# Patient Record
Sex: Male | Born: 1955 | Race: White | Hispanic: No | Marital: Married | Smoking: Former smoker
Health system: Southern US, Community
[De-identification: ages and names within clinical notes are randomized; demographics above are authoritative.]

## PROBLEM LIST (undated history)

## (undated) DIAGNOSIS — M1990 Unspecified osteoarthritis, unspecified site: Secondary | ICD-10-CM

## (undated) DIAGNOSIS — K219 Gastro-esophageal reflux disease without esophagitis: Secondary | ICD-10-CM

## (undated) DIAGNOSIS — I719 Aortic aneurysm of unspecified site, without rupture: Secondary | ICD-10-CM

## (undated) DIAGNOSIS — M5416 Radiculopathy, lumbar region: Secondary | ICD-10-CM

## (undated) DIAGNOSIS — R9439 Abnormal result of other cardiovascular function study: Secondary | ICD-10-CM

## (undated) DIAGNOSIS — R000 Tachycardia, unspecified: Secondary | ICD-10-CM

## (undated) DIAGNOSIS — I2510 Atherosclerotic heart disease of native coronary artery without angina pectoris: Secondary | ICD-10-CM

## (undated) DIAGNOSIS — G8929 Other chronic pain: Secondary | ICD-10-CM

## (undated) DIAGNOSIS — J189 Pneumonia, unspecified organism: Secondary | ICD-10-CM

## (undated) HISTORY — PX: UMBILICAL HERNIA REPAIR: SHX196

## (undated) HISTORY — PX: BACK SURGERY: SHX140

## (undated) HISTORY — PX: HERNIA REPAIR: SHX51

## (undated) HISTORY — PX: PAIN PUMP REMOVAL: SHX6391

## (undated) HISTORY — PX: SPINAL CORD STIMULATOR IMPLANT: SHX2422

## (undated) HISTORY — PX: PAIN PUMP IMPLANTATION: SHX330

---

## 1980-03-31 HISTORY — PX: LUMBAR LAMINECTOMY: SHX95

## 1992-03-31 DIAGNOSIS — J189 Pneumonia, unspecified organism: Secondary | ICD-10-CM

## 1992-03-31 HISTORY — DX: Pneumonia, unspecified organism: J18.9

## 1994-03-31 HISTORY — PX: CERVICAL LAMINECTOMY: SHX94

## 1996-03-31 HISTORY — PX: POSTERIOR LUMBAR FUSION: SHX6036

## 2015-03-01 HISTORY — PX: SHOULDER ARTHROSCOPY WITH ROTATOR CUFF REPAIR AND OPEN BICEPS TENODESIS: SHX6677

## 2015-09-25 DIAGNOSIS — R9439 Abnormal result of other cardiovascular function study: Secondary | ICD-10-CM

## 2015-09-25 HISTORY — DX: Abnormal result of other cardiovascular function study: R94.39

## 2015-10-03 ENCOUNTER — Encounter (HOSPITAL_COMMUNITY)
Admission: AD | Disposition: A | Discharge status: Home / Self Care | Payer: Self-pay | Source: Other Acute Inpatient Hospital | Attending: Cardiology

## 2015-10-03 ENCOUNTER — Observation Stay (HOSPITAL_COMMUNITY)
Admission: AD | Admit: 2015-10-03 | Discharge: 2015-10-04 | Disposition: A | Discharge status: Home / Self Care | Payer: Medicare HMO | Source: Other Acute Inpatient Hospital | Attending: Cardiology | Admitting: Cardiology

## 2015-10-03 ENCOUNTER — Encounter (HOSPITAL_COMMUNITY): Payer: Self-pay | Admitting: Physician Assistant

## 2015-10-03 DIAGNOSIS — R079 Chest pain, unspecified: Secondary | ICD-10-CM | POA: Diagnosis present

## 2015-10-03 DIAGNOSIS — R001 Bradycardia, unspecified: Secondary | ICD-10-CM | POA: Diagnosis not present

## 2015-10-03 DIAGNOSIS — I25110 Atherosclerotic heart disease of native coronary artery with unstable angina pectoris: Secondary | ICD-10-CM | POA: Diagnosis not present

## 2015-10-03 DIAGNOSIS — I959 Hypotension, unspecified: Secondary | ICD-10-CM | POA: Diagnosis not present

## 2015-10-03 DIAGNOSIS — R931 Abnormal findings on diagnostic imaging of heart and coronary circulation: Secondary | ICD-10-CM

## 2015-10-03 DIAGNOSIS — I712 Thoracic aortic aneurysm, without rupture, unspecified: Secondary | ICD-10-CM

## 2015-10-03 DIAGNOSIS — I200 Unstable angina: Secondary | ICD-10-CM | POA: Diagnosis not present

## 2015-10-03 DIAGNOSIS — M549 Dorsalgia, unspecified: Secondary | ICD-10-CM | POA: Diagnosis not present

## 2015-10-03 DIAGNOSIS — G8929 Other chronic pain: Secondary | ICD-10-CM | POA: Insufficient documentation

## 2015-10-03 DIAGNOSIS — R072 Precordial pain: Secondary | ICD-10-CM | POA: Diagnosis present

## 2015-10-03 DIAGNOSIS — R9439 Abnormal result of other cardiovascular function study: Secondary | ICD-10-CM | POA: Diagnosis present

## 2015-10-03 DIAGNOSIS — Z7982 Long term (current) use of aspirin: Secondary | ICD-10-CM | POA: Insufficient documentation

## 2015-10-03 DIAGNOSIS — E7800 Pure hypercholesterolemia, unspecified: Secondary | ICD-10-CM | POA: Insufficient documentation

## 2015-10-03 DIAGNOSIS — I2510 Atherosclerotic heart disease of native coronary artery without angina pectoris: Secondary | ICD-10-CM | POA: Insufficient documentation

## 2015-10-03 DIAGNOSIS — Z87891 Personal history of nicotine dependence: Secondary | ICD-10-CM | POA: Insufficient documentation

## 2015-10-03 HISTORY — DX: Other chronic pain: G89.29

## 2015-10-03 HISTORY — DX: Abnormal result of other cardiovascular function study: R94.39

## 2015-10-03 HISTORY — DX: Aortic aneurysm of unspecified site, without rupture: I71.9

## 2015-10-03 HISTORY — DX: Pneumonia, unspecified organism: J18.9

## 2015-10-03 HISTORY — DX: Gastro-esophageal reflux disease without esophagitis: K21.9

## 2015-10-03 HISTORY — DX: Tachycardia, unspecified: R00.0

## 2015-10-03 HISTORY — DX: Atherosclerotic heart disease of native coronary artery without angina pectoris: I25.10

## 2015-10-03 HISTORY — DX: Unspecified osteoarthritis, unspecified site: M19.90

## 2015-10-03 HISTORY — DX: Radiculopathy, lumbar region: M54.16

## 2015-10-03 HISTORY — PX: CARDIAC CATHETERIZATION: SHX172

## 2015-10-03 LAB — CBC
HEMATOCRIT: 46.3 % (ref 39.0–52.0)
HEMOGLOBIN: 15.6 g/dL (ref 13.0–17.0)
MCH: 30.2 pg (ref 26.0–34.0)
MCHC: 33.7 g/dL (ref 30.0–36.0)
MCV: 89.7 fL (ref 78.0–100.0)
Platelets: 205 10*3/uL (ref 150–400)
RBC: 5.16 MIL/uL (ref 4.22–5.81)
RDW: 13.1 % (ref 11.5–15.5)
WBC: 6.1 10*3/uL (ref 4.0–10.5)

## 2015-10-03 LAB — CREATININE, SERUM: Creatinine, Ser: 1.04 mg/dL (ref 0.61–1.24)

## 2015-10-03 LAB — TSH: TSH: 2.255 u[IU]/mL (ref 0.350–4.500)

## 2015-10-03 SURGERY — LEFT HEART CATH AND CORONARY ANGIOGRAPHY
Anesthesia: LOCAL

## 2015-10-03 MED ORDER — IOPAMIDOL (ISOVUE-370) INJECTION 76%
INTRAVENOUS | Status: AC
  Filled 2015-10-03: qty 100

## 2015-10-03 MED ORDER — LIDOCAINE HCL (PF) 1 % IJ SOLN
INTRAMUSCULAR | Status: AC
  Filled 2015-10-03: qty 30

## 2015-10-03 MED ORDER — FENTANYL CITRATE (PF) 100 MCG/2ML IJ SOLN
INTRAMUSCULAR | Status: DC | PRN
  Administered 2015-10-03: 50 ug via INTRAVENOUS

## 2015-10-03 MED ORDER — VERAPAMIL HCL 2.5 MG/ML IV SOLN
INTRAVENOUS | Status: DC | PRN
  Administered 2015-10-03: 10 mL via INTRA_ARTERIAL

## 2015-10-03 MED ORDER — ASPIRIN EC 81 MG PO TBEC: 81.0000 mg | DELAYED_RELEASE_TABLET | Freq: Every day | ORAL | Status: DC

## 2015-10-03 MED ORDER — ONDANSETRON HCL 4 MG/2ML IJ SOLN: 4.0000 mg | Freq: Four times a day (QID) | INTRAMUSCULAR | Status: DC | PRN

## 2015-10-03 MED ORDER — TRAZODONE HCL 100 MG PO TABS
100.0000 mg | ORAL_TABLET | Freq: Every evening | ORAL | Status: AC | PRN
  Administered 2015-10-03: 100 mg via ORAL
  Filled 2015-10-03: qty 1

## 2015-10-03 MED ORDER — ACETAMINOPHEN 325 MG PO TABS: 650.0000 mg | ORAL_TABLET | ORAL | Status: DC | PRN

## 2015-10-03 MED ORDER — SODIUM CHLORIDE 0.9 % IV SOLN: 250.0000 mL | INTRAVENOUS | Status: DC | PRN

## 2015-10-03 MED ORDER — HEPARIN SODIUM (PORCINE) 5000 UNIT/ML IJ SOLN
5000.0000 [IU] | Freq: Three times a day (TID) | INTRAMUSCULAR | Status: DC
  Administered 2015-10-04: 5000 [IU] via SUBCUTANEOUS
  Filled 2015-10-03: qty 1

## 2015-10-03 MED ORDER — HYDROCODONE-ACETAMINOPHEN 5-325 MG PO TABS
1.0000 | ORAL_TABLET | Freq: Four times a day (QID) | ORAL | Status: AC | PRN
  Administered 2015-10-03 – 2015-10-04 (×2): 1 via ORAL
  Filled 2015-10-03 (×2): qty 1

## 2015-10-03 MED ORDER — MIDAZOLAM HCL 2 MG/2ML IJ SOLN
INTRAMUSCULAR | Status: AC
Start: 2015-10-03 — End: 2015-10-03
  Filled 2015-10-03: qty 2

## 2015-10-03 MED ORDER — LIDOCAINE HCL (PF) 1 % IJ SOLN
INTRAMUSCULAR | Status: DC | PRN
  Administered 2015-10-03: 3 mL

## 2015-10-03 MED ORDER — HEPARIN SODIUM (PORCINE) 1000 UNIT/ML IJ SOLN
INTRAMUSCULAR | Status: AC
  Filled 2015-10-03: qty 1

## 2015-10-03 MED ORDER — VERAPAMIL HCL 2.5 MG/ML IV SOLN
INTRAVENOUS | Status: AC
  Filled 2015-10-03: qty 2

## 2015-10-03 MED ORDER — ATORVASTATIN CALCIUM 80 MG PO TABS: 80.0000 mg | ORAL_TABLET | Freq: Every day | ORAL | Status: DC

## 2015-10-03 MED ORDER — IOPAMIDOL (ISOVUE-370) INJECTION 76%
INTRAVENOUS | Status: DC | PRN
  Administered 2015-10-03: 90 mL via INTRA_ARTERIAL

## 2015-10-03 MED ORDER — SODIUM CHLORIDE 0.9 % WEIGHT BASED INFUSION
3.0000 mL/kg/h | INTRAVENOUS | Status: AC
  Administered 2015-10-03: 3 mL/kg/h via INTRAVENOUS

## 2015-10-03 MED ORDER — SODIUM CHLORIDE 0.9% FLUSH: 3.0000 mL | INTRAVENOUS | Status: DC | PRN

## 2015-10-03 MED ORDER — SODIUM CHLORIDE 0.9 % IV SOLN
INTRAVENOUS | Status: DC | PRN
  Administered 2015-10-03: 100 mL/h via INTRAVENOUS

## 2015-10-03 MED ORDER — NITROGLYCERIN 0.4 MG SL SUBL: 0.4000 mg | SUBLINGUAL_TABLET | SUBLINGUAL | Status: DC | PRN

## 2015-10-03 MED ORDER — SODIUM CHLORIDE 0.9% FLUSH
3.0000 mL | Freq: Two times a day (BID) | INTRAVENOUS | Status: DC
  Administered 2015-10-03: 3 mL via INTRAVENOUS

## 2015-10-03 MED ORDER — HEPARIN SODIUM (PORCINE) 1000 UNIT/ML IJ SOLN
INTRAMUSCULAR | Status: DC | PRN
  Administered 2015-10-03: 5000 [IU] via INTRAVENOUS

## 2015-10-03 MED ORDER — FENTANYL CITRATE (PF) 100 MCG/2ML IJ SOLN
INTRAMUSCULAR | Status: AC
  Filled 2015-10-03: qty 2

## 2015-10-03 MED ORDER — MIDAZOLAM HCL 2 MG/2ML IJ SOLN
INTRAMUSCULAR | Status: DC | PRN
  Administered 2015-10-03: 2 mg via INTRAVENOUS

## 2015-10-03 MED ORDER — HEPARIN (PORCINE) IN NACL 2-0.9 UNIT/ML-% IJ SOLN
INTRAMUSCULAR | Status: DC | PRN
  Administered 2015-10-03: 1000 mL

## 2015-10-03 MED ORDER — HEPARIN (PORCINE) IN NACL 2-0.9 UNIT/ML-% IJ SOLN
INTRAMUSCULAR | Status: AC
  Filled 2015-10-03: qty 1000

## 2015-10-03 SURGICAL SUPPLY — 11 items

## 2015-10-03 NOTE — Progress Notes (Addendum)
Patient arrived with [facility]  From [facility] to room 2w17, patient was placed on monitor and CCMD was called, Dr. Swaziland in room to see patient. Cath lab called and arrived to pick up patient for Cath, Patient was consented and Cath lab staff took patient to cath lab on transport monitor.  Shubh Chiara, Randall An RN

## 2015-10-03 NOTE — H&P (Signed)
Patient ID: Jacques Fife MRN: [id number], DOB/AGE: 31-May-1955   Admit date: 10/03/2015   Primary Physician: No primary care provider on file.  Dr. Sherryll Burger [facility]) Primary Cardiologist: New  Pt. Profile:  Yussuf Sawdey is a 60 y.o. male with no significant past medical except chronic back pain and multiple back surgeries hx who transferred from [facility] ER for cath.   HPI: As above. Intermitted history of substernal chest pressure with exertion for the past 3 weeks. Lasting few minutes to hours. Radiation across his lower chest. No exertional shortness of breath. Never smoker. Recent underwent Myoview 09/25/15 which showed moderate reversibility in an anterior and inferior apical myocardium. Study was high risk. He presented to the [facility] ED today with chest pain. We're point-of-care troponin negative. EKG shows T-wave inversion in inferior anterior lead. Creatinine normal. Chest x-ray clear. BNP normal. He was given aspirin, statin. Started on IV heparin and IV nitroglycerin and sent to the [facility] ED for cath. The patient denies nausea, vomiting, lower extremity edema, orthopnea, PND, syncope.  Reports history of palpitation many years ago.  Problem List  Past Medical History  Diagnosis Date  . Abnormal nuclear stress test 09/25/15  . GERD (gastroesophageal reflux disease)   . Lumbar pain     Past Surgical History  Procedure Laterality Date  . Back surgery    . Rotator cuff repair       Allergies  Allergies not on file   Home Medications  Prior to Admission medications   Not on File    Family History  Family History  Problem Relation Age of Onset  . Heart attack Father     Social History  Social History   Social History  . Marital Status: Married    Spouse Name: N/A  . Number of Children: N/A  . Years of Education: N/A   Occupational History  . Not on file.   Social History Main Topics  . Smoking status: Never Smoker   . Smokeless  tobacco: Not on file  . Alcohol Use: Not on file  . Drug Use: Not on file  . Sexual Activity: Not on file   Other Topics Concern  . Not on file   Social History Narrative  . No narrative on file     All other systems reviewed and are otherwise negative except as noted above.  Physical Exam  There were no vitals taken for this visit.  General: Pleasant, NAD Psych: Normal affect. Neuro: Alert and oriented X 3. Moves all extremities spontaneously. HEENT: Normal  Neck: Supple without bruits or JVD. Lungs:  Resp regular and unlabored, CTA. Heart: RRR no s3, s4, or murmurs. Abdomen: Soft, non-tender, non-distended, BS + x 4.  Extremities: No clubbing, cyanosis or edema. DP/PT/Radials 2+ and equal bilaterally.  Labs  No results for input(s): CKTOTAL, CKMB, TROPONINI in the last 72 hours. No results found for: WBC, HGB, HCT, MCV, PLT No results for input(s): NA, K, CL, CO2, BUN, CREATININE, CALCIUM, PROT, BILITOT, ALKPHOS, ALT, AST, GLUCOSE in the last 168 hours.  Invalid input(s): LABALBU No results found for: CHOL, HDL, LDLCALC, TRIG No results found for: DDIMER   Radiology/Studies  No results found.  ECG  EKG from [facility] showed normal sinus rhythm at a rate of 56 bpm T-wave inversion in inferior anterior lead.   ASSESSMENT AND PLAN   1. Chest pain - Concerning for anginal pain. Recently had high-risk Myoview. Ongoing chest pain for the past 3  weeks. EKG with T-wave inversion in inferior anterior lead. Continue IV heparin and nitroglycerin. For cath later today. - Check lipid panel, A1c, CBC, BMET and TSH in morning.  SignedManson Passey, PA-C 10/03/2015, 3:37 PM Pager [phone] Patient seen and examined and history reviewed. Agree with above findings and plan.  60 yo WM presents with 3 week history of intermittent chest pain sometimes lasting 3 hours. Recent stress Myoview last week high risk with anterior and inferoapical ischemia. EF 50%. Ecg today  shows T wave inversion in the anterior and inferior leads. Troponin negative. CXR OK. Other labs normal. Will admit. Continue IV heparin and NTG. Statin.  Plan LHC and possible coronary intervention today. The procedure and risks were reviewed including but not limited to death, myocardial infarction, stroke, arrythmias, bleeding, transfusion, emergency surgery, dye allergy, or renal dysfunction. The patient voices understanding and is agreeable to proceed.   Karanvir Balderston Swaziland, MDFACC 10/03/2015 3:43 PM

## 2015-10-03 NOTE — Progress Notes (Signed)
Patient with some bleeding at TR band site, 6cc air in band, 2CC added (that was previously removed) bleeding has stopped will wait 30 minutes before removing more air per protocol. Will monitor patient. Kamyla Olejnik, Randall An RN

## 2015-10-03 NOTE — Interval H&P Note (Signed)
History and Physical Interval Note:  10/03/2015 3:47 PM  Brian Mccormick  has presented today for surgery, with the diagnosis of Chest Pain  The various methods of treatment have been discussed with the patient and family. After consideration of risks, benefits and other options for treatment, the patient has consented to  Procedure(s): Left Heart Cath and Coronary Angiography (N/A) as a surgical intervention .  The patient's history has been reviewed, patient examined, no change in status, stable for surgery.  I have reviewed the patient's chart and labs.  Questions were answered to the patient's satisfaction.   Cath Lab Visit (complete for each Cath Lab visit)  Clinical Evaluation Leading to the Procedure:   ACS: Yes.    Non-ACS:    Anginal Classification: CCS IV  Anti-ischemic medical therapy: Minimal Therapy (1 class of medications)  Non-Invasive Test Results: High-risk stress test findings: cardiac mortality >3%/year  Prior CABG: No previous CABG        Theron Arista [facility] 10/03/2015 3:47 PM

## 2015-10-04 ENCOUNTER — Observation Stay (HOSPITAL_BASED_OUTPATIENT_CLINIC_OR_DEPARTMENT_OTHER): Payer: Medicare HMO

## 2015-10-04 ENCOUNTER — Observation Stay (HOSPITAL_COMMUNITY): Payer: Medicare HMO

## 2015-10-04 ENCOUNTER — Ambulatory Visit: Payer: Self-pay | Admitting: Cardiology

## 2015-10-04 ENCOUNTER — Encounter (HOSPITAL_COMMUNITY): Payer: Self-pay | Admitting: Cardiology

## 2015-10-04 DIAGNOSIS — R079 Chest pain, unspecified: Secondary | ICD-10-CM | POA: Diagnosis not present

## 2015-10-04 DIAGNOSIS — R931 Abnormal findings on diagnostic imaging of heart and coronary circulation: Secondary | ICD-10-CM | POA: Diagnosis not present

## 2015-10-04 DIAGNOSIS — I2510 Atherosclerotic heart disease of native coronary artery without angina pectoris: Secondary | ICD-10-CM | POA: Diagnosis not present

## 2015-10-04 DIAGNOSIS — M549 Dorsalgia, unspecified: Secondary | ICD-10-CM | POA: Diagnosis not present

## 2015-10-04 DIAGNOSIS — I200 Unstable angina: Secondary | ICD-10-CM | POA: Diagnosis not present

## 2015-10-04 DIAGNOSIS — I25110 Atherosclerotic heart disease of native coronary artery with unstable angina pectoris: Secondary | ICD-10-CM | POA: Diagnosis not present

## 2015-10-04 DIAGNOSIS — G8929 Other chronic pain: Secondary | ICD-10-CM | POA: Diagnosis not present

## 2015-10-04 LAB — ECHOCARDIOGRAM COMPLETE
AO mean calculated velocity dopler: 137 cm/s
AOPV: 0.44 m/s
AOVTI: 36.3 cm
AV Area VTI index: 0.69 cm2/m2
AV Area mean vel: 1.32 cm2
AV peak Index: 0.63
AV pk vel: 172 cm/s
AV vel: 1.52
AVAREAMEANVIN: 0.6 cm2/m2
AVAREAVTI: 1.39 cm2
AVCELMEANRAT: 0.42
AVG: 8 mmHg
AVPG: 12 mmHg
CHL CUP AV VALUE AREA INDEX: 0.69
E decel time: 359 msec
E/e' ratio: 9.41
FS: 24 % — AB (ref 28–44)
HEIGHTINCHES: 71 in
IV/PV OW: 0.78
LA ID, A-P, ES: 34 mm
LA diam end sys: 34 mm
LA vol index: 15.7 mL/m2
LA vol: 34.6 mL
LADIAMINDEX: 1.55 cm/m2
LAVOLA4C: 28.7 mL
LDCA: 3.14 cm2
LV PW d: 12.9 mm — AB (ref 0.6–1.1)
LV SIMPSON'S DISK: 45
LV TDI E'LATERAL: 5.77
LV TDI E'MEDIAL: 6.64
LV e' LATERAL: 5.77 cm/s
LV sys vol: 43 mL (ref 21–61)
LVDIAVOL: 78 mL (ref 62–150)
LVDIAVOLIN: 36 mL/m2
LVEEAVG: 9.41
LVEEMED: 9.41
LVOT SV: 55 mL
LVOT VTI: 17.6 cm
LVOT diameter: 20 mm
LVOT peak VTI: 0.48 cm
LVOT peak vel: 76.1 cm/s
LVSYSVOLIN: 20 mL/m2
MV Dec: 359
MV pk E vel: 54.3 m/s
MVPKAVEL: 81 m/s
Stroke v: 35 ml
Valve area: 1.52 cm2
WEIGHTICAEL: 3544 [oz_av]

## 2015-10-04 LAB — CBC
HCT: 47.5 % (ref 39.0–52.0)
Hemoglobin: 16 g/dL (ref 13.0–17.0)
MCH: 30.2 pg (ref 26.0–34.0)
MCHC: 33.7 g/dL (ref 30.0–36.0)
MCV: 89.8 fL (ref 78.0–100.0)
PLATELETS: 215 10*3/uL (ref 150–400)
RBC: 5.29 MIL/uL (ref 4.22–5.81)
RDW: 13.1 % (ref 11.5–15.5)
WBC: 6.1 10*3/uL (ref 4.0–10.5)

## 2015-10-04 LAB — BASIC METABOLIC PANEL
ANION GAP: 5 (ref 5–15)
BUN: 17 mg/dL (ref 6–20)
CALCIUM: 8.9 mg/dL (ref 8.9–10.3)
CO2: 23 mmol/L (ref 22–32)
CREATININE: 1.07 mg/dL (ref 0.61–1.24)
Chloride: 109 mmol/L (ref 101–111)
GLUCOSE: 94 mg/dL (ref 65–99)
POTASSIUM: 4.1 mmol/L (ref 3.5–5.1)
SODIUM: 137 mmol/L (ref 135–145)

## 2015-10-04 LAB — LIPID PANEL
CHOLESTEROL: 204 mg/dL — AB (ref 0–200)
HDL: 49 mg/dL (ref >40)
LDL Cholesterol: 124 mg/dL — ABNORMAL HIGH (ref 0–99)
Total CHOL/HDL Ratio: 4.2 RATIO
Triglycerides: 155 mg/dL — ABNORMAL HIGH (ref <150)
VLDL: 31 mg/dL (ref 0–40)

## 2015-10-04 LAB — HEMOGLOBIN A1C
HEMOGLOBIN A1C: 5.5 % (ref 4.8–5.6)
MEAN PLASMA GLUCOSE: 111 mg/dL

## 2015-10-04 MED ORDER — ATORVASTATIN CALCIUM 40 MG PO TABS: 40.0000 mg | ORAL_TABLET | Freq: Every day | ORAL | Status: DC

## 2015-10-04 MED ORDER — IOPAMIDOL (ISOVUE-370) INJECTION 76%
INTRAVENOUS | Status: AC
  Administered 2015-10-04: 100 mL
  Filled 2015-10-04: qty 100

## 2015-10-04 MED ORDER — ASPIRIN 81 MG PO TBEC: 81.0000 mg | DELAYED_RELEASE_TABLET | Freq: Every day | ORAL | Status: DC

## 2015-10-04 MED FILL — Nitroglycerin IV Soln 200 MCG/ML in D5W: INTRAVENOUS | Qty: 250 | Status: AC

## 2015-10-04 MED FILL — Heparin Sodium (Porcine) 100 Unt/ML in Sodium Chloride 0.45%: INTRAMUSCULAR | Qty: 250 | Status: AC

## 2015-10-04 NOTE — Care Management Note (Addendum)
Case Management Note  Patient Details  Name: Brian Mccormick MRN: [id number] Date of Birth: 09-09-1955  Subjective/Objective:   60 y.o. M admitted to 2W17 with CP. Pt lives in private residence with wife in [place]. Independent pta. Do not anticipate any HH needs.                  Action/Plan:Will continue to follow for discharge/disposition needs.   Expected Discharge Date:                  Expected Discharge Plan:     In-House Referral:     Discharge planning Services  CM Consult  Post Acute Care Choice:    Choice offered to:     DME Arranged:    DME Agency:     HH Arranged:    HH Agency:     Status of Service:  In process, will continue to follow  If discussed at Long Length of Stay Meetings, dates discussed:    Additional Comments:  Yvone Neu, RN 10/04/2015, 10:57 AM

## 2015-10-04 NOTE — Discharge Summary (Signed)
Discharge Summary    Patient ID: Brian Mccormick,  MRN: [id number], DOB/AGE: [birth year] 60 y.o.  Admit date: 10/03/2015 Discharge date: 10/04/2015  Primary Care Provider: [facility] Primary Cardiologist: New - Wants to Follow-Up in [place], [place]  Discharge Diagnoses    Principal Problem:   Unstable angina [facility]) Active Problems:   Abnormal nuclear stress test   Chest pain  History of Present Illness     Brian Mccormick is a 60 y.o. male with no significant past medical except chronic back pain and multiple back surgeries who was transferred from [facility] to [facility] on 10/03/2015 for evaluation of chest pain.   He reported a history of intermittent substernal chest pressure with exertion for the past 3 weeks, lasting a few minutes to hours. Also reported radiation across his lower chest. No exertional shortness of breath. He had a recent Myoview on 09/25/15 which showed moderate reversibility in an anterior and inferior apical myocardium and was overall high-risk. His initial POC troponin was negative but his EKG did show T-wave inversion in the  inferior and anterior leads. He was given aspirin and a statin along with being started on IV heparin and IV nitroglycerin and sent to the [facility] ED for a likely cardiac catheterization.   Hospital Course     Consultants: None  The risks and benefits of the procedure were discussed with the patient and he agreed to proceed. His cardiac catheterization showed nonobstructive CAD with 50% stenosis in Ost 2nd Diag, 20% stenosis Prox Cx, and 20% Prox RCA. LV function was normal by cath but an enlarged thoracic aorta c/w aneurysm was noted. Therefore a CT angiogram of the aorta and echo were planned for the following day.  On 10/04/2015, he denied any repeat episodes of chest discomfort. His right radial cath site was stable without evidence of ecchymosis or a hematoma. LDL was elevated to 124, therefore he will remain on statin therapy at  time of discharge.  An echocardiogram was obtained and showed an EF of 40% to 45% with grade 1 diastolic dysfunction. A CT of the aorta showed a 4.1 cm ascending aortic aneurysm, without complicating features. Annual imaging followup by CTA or MRA was recommended.   He was last examined by Dr. Swaziland and deemed stable for discharge. He was not started on a BB secondary to bradycardia (HR in the high-40's to mid-50's overnight) nor an ACE-I/ARB due to hypotension. Would consider the addition of these medications at time of follow-up if HR and BP allows. Cardiology hospital follow-up has been arranged with Dr. Purvis Sheffield in [place], [place].  _____________  Discharge Vitals Blood pressure 116/77, pulse 61, temperature 98 F (36.7 C), temperature source Oral, resp. rate 17, height 5\' 11"  (1.803 m), weight 221 lb 8 oz (100.472 kg), SpO2 95 %.  Filed Weights   10/03/15 1649  Weight: 221 lb 8 oz (100.472 kg)    Labs & Radiologic Studies     CBC  Recent Labs  10/03/15 1651 10/04/15 0300  WBC 6.1 6.1  HGB 15.6 16.0  HCT 46.3 47.5  MCV 89.7 89.8  PLT 205 215   Basic Metabolic Panel  Recent Labs  10/03/15 1651 10/04/15 0300  NA  --  137  K  --  4.1  CL  --  109  CO2  --  23  GLUCOSE  --  94  BUN  --  17  CREATININE 1.04 1.07  CALCIUM  --  8.9   Hemoglobin  A1C  Recent Labs  10/03/15 1651  HGBA1C 5.5   Fasting Lipid Panel  Recent Labs  10/04/15 0300  CHOL 204*  HDL 49  LDLCALC 124*  TRIG 155*  CHOLHDL 4.2   Thyroid Function Tests  Recent Labs  10/03/15 1651  TSH 2.255    Ct Angio Chest Aorta W/cm &/or Wo/cm: 10/04/2015  CLINICAL DATA:  chest pains off and on for several weeks. Yesterday while in his Dr's office he had these pains and was sent to the ED Chest pain- atypical features. Recent false positive stress Myoview. Cardiac cath shows nonobstructive CAD. EF 50%. Echo results pending. I reviewed personally. LV function appears mildly impaired with EF appoximately  45%. The aortic root is enlarged at 4 cm. The AV is thickened and calcified and appears bicuspid. There is no significant AI or AS. EXAM: CT ANGIOGRAPHY CHEST WITH CONTRAST TECHNIQUE: Multidetector CT imaging of the chest was performed using the standard protocol during bolus administration of intravenous contrast. Multiplanar CT image reconstructions and MIPs were obtained to evaluate the vascular anatomy. CONTRAST:  Isovue 370 COMPARISON:  None. FINDINGS: Vascular: Left arm IV contrast administration. Innominate vein and SVC are patent. Right atrium and right ventricle are nondilated. Satisfactory opacification of pulmonary arteries noted, and there is no evidence of pulmonary emboli. Patent bilateral pulmonary veins drain into the left atrium. Scattered coronary calcifications. Calcifications on aortic valve leaflets. Proximal ascending aorta measures 4.1 cm maximum transverse diameter, narrowing to 3.2 cm in the distal ascending/ proximal arch. Scattered atheromatous plaque in the distal arch and descending thoracic aorta, normal in caliber. No dissection or stenosis. Classic 3 vessel brachiocephalic arterial origin anatomy without proximal stenosis. Visualized suprarenal abdominal aorta unremarkable. Mediastinum/Lymph Nodes: No masses or pathologically enlarged lymph nodes identified. No pericardial effusion. Lungs/Pleura: Dependent atelectasis posteriorly. No confluent airspace consolidation or nodule. No pneumothorax. No pleural effusion. Upper abdomen: No acute findings. Musculoskeletal: No chest wall mass or suspicious bone lesions identified. Dorsal epidural stimulator catheter extends up to T8 level. Review of the MIP images confirms the above findings. IMPRESSION: 1. 4.1 cm ascending aortic aneurysm, without complicating features. Recommend annual imaging followup by CTA or MRA. This recommendation follows 2010 ACCF/AHA/AATS/ACR/ASA/SCA/SCAI/SIR/STS/SVM Guidelines for the Diagnosis and Management  of Patients with Thoracic Aortic Disease. Circulation. 2010; 121: W098-J191 2. Atherosclerosis, including Aortic Atherosclerosis (ICD10-170.0) and coronary artery disease. Please note that although the presence of coronary artery calcium documents the presence of coronary artery disease, the severity of this disease and any potential stenosis cannot be assessed on this non-gated CT examination. Assessment for potential risk factor modification, dietary therapy or pharmacologic therapy may be warranted, if clinically indicated. Electronically Signed   By: Corlis Leak M.D.   On: 10/04/2015 10:56     Diagnostic Studies/Procedures     Cardiac Catheterization: 10/03/2015  Prox RCA lesion, 20% stenosed.  Prox Cx lesion, 20% stenosed.  Ost 2nd Diag lesion, 50% stenosed.  The left ventricular systolic function is normal.  1. Nonobstructive CAD 2. Normal LV function 3. Enlarged thoracic aorta c/w aneurysm.  Plan: check Echo. Obtain CT angiogram of the aorta.   Echocardiogram: 10/04/2015 Study Conclusions  - Left ventricle: Inferior and septal hypokinesis. The cavity size  was mildly dilated. Wall thickness was normal. Systolic function  was mildly to moderately reduced. The estimated ejection fraction  was in the range of 40% to 45%. Doppler parameters are consistent  with abnormal left ventricular relaxation (grade 1 diastolic  dysfunction). - Aortic valve: There was  trivial regurgitation. Valve area (VTI):  1.52 cm^2. Valve area (Vmax): 1.39 cm^2. Valve area (Vmean): 1.32  cm^2. - Aorta: Mild dilatation of sinuses and aortic root consider TEE or  cardiac CTA if there is a concern for dissection. - Atrial septum: No defect or patent foramen ovale was identified.    Disposition   Pt is being discharged home today in good condition.  Follow-up Plans & Appointments    Follow-up Information    Follow up with Prentice Docker, MD On 10/16/2015.   Specialty:  Cardiology    Why:  Cardiology Hospital Follow-Up on 10/16/2015 on 10/16/2015 at 3:40PM.    Contact information:   [address] [place] [address] [phone]          Discharge Instructions    Diet - low sodium heart healthy    Complete by:  As directed      Discharge instructions    Complete by:  As directed   PLEASE REMEMBER TO BRING ALL OF YOUR MEDICATIONS TO EACH OF YOUR FOLLOW-UP OFFICE VISITS.  PLEASE ATTEND ALL SCHEDULED FOLLOW-UP APPOINTMENTS.   Activity: Increase activity slowly as tolerated. You may shower, but no soaking baths (or swimming) for 1 week. No driving for 24 hours. No lifting over 5 lbs for 1 week. No sexual activity for 1 week.   You May Return to Work: in 1 week (if applicable)  Wound Care: You may wash cath site gently with soap and water. Keep cath site clean and dry. If you notice pain, swelling, bleeding or pus at your cath site, please call [phone].           Discharge Medications     Medication List    TAKE these medications        ALPRAZolam 0.5 MG tablet  Commonly known as:  XANAX  Take 0.5 mg by mouth every morning.     aspirin 81 MG EC tablet  Take 1 tablet (81 mg total) by mouth daily.     atorvastatin 40 MG tablet  Commonly known as:  LIPITOR  Take 1 tablet (40 mg total) by mouth daily at 6 PM.     escitalopram 10 MG tablet  Commonly known as:  LEXAPRO  Take 10 mg by mouth every morning.     HYDROcodone-acetaminophen 10-325 MG tablet  Commonly known as:  NORCO  Take 1 tablet by mouth every 4 (four) hours.     omeprazole 20 MG capsule  Commonly known as:  PRILOSEC  Take 20 mg by mouth daily as needed (for indigestion/heartburn).     polyethylene glycol powder powder  Commonly known as:  GLYCOLAX/MIRALAX  Mix 17 grams with 6-8 ounces of water or juice and drink once every morning     traZODone 100 MG tablet  Commonly known as:  DESYREL  Take 100 mg by mouth at bedtime.        Allergies No Known  Allergies  Outstanding Labs/Studies   None  Duration of Discharge Encounter   Greater than 30 minutes including physician time.  Signed, Ellsworth Lennox, PA-C 10/04/2015, 1:40 PM

## 2015-10-04 NOTE — Progress Notes (Signed)
Echocardiogram 2D Echocardiogram has been performed.  Nolon Rod 10/04/2015, 9:39 AM

## 2015-10-04 NOTE — Progress Notes (Signed)
TELEMETRY: Reviewed telemetry pt in NSR: Filed Vitals:   10/03/15 1900 10/03/15 1941 10/03/15 2050 10/04/15 0644  BP: 106/93  112/87 116/77  Pulse: 51 62 64 61  Temp:   97.8 F (36.6 C) 98 F (36.7 C)  TempSrc:   Oral Oral  Resp:   18 17  Height:      Weight:      SpO2: 98% 97% 97% 95%    Intake/Output Summary (Last 24 hours) at 10/04/15 0954 Last data filed at 10/04/15 0339  Gross per 24 hour  Intake 180.25 ml  Output   1300 ml  Net -1119.75 ml   Filed Weights   10/03/15 1649  Weight: 221 lb 8 oz (100.472 kg)    Subjective Feels well this am. No chest pain.  Marland Kitchen aspirin EC  81 mg Oral Daily  . atorvastatin  80 mg Oral q1800  . heparin  5,000 Units Subcutaneous Q8H  . iopamidol      . sodium chloride flush  3 mL Intravenous Q12H      LABS: Basic Metabolic Panel:  Recent Labs  57/84/69 1651 10/04/15 0300  NA  --  137  K  --  4.1  CL  --  109  CO2  --  23  GLUCOSE  --  94  BUN  --  17  CREATININE 1.04 1.07  CALCIUM  --  8.9   Liver Function Tests: No results for input(s): AST, ALT, ALKPHOS, BILITOT, PROT, ALBUMIN in the last 72 hours. No results for input(s): LIPASE, AMYLASE in the last 72 hours. CBC:  Recent Labs  10/03/15 1651 10/04/15 0300  WBC 6.1 6.1  HGB 15.6 16.0  HCT 46.3 47.5  MCV 89.7 89.8  PLT 205 215   Cardiac Enzymes: No results for input(s): CKTOTAL, CKMB, CKMBINDEX, TROPONINI in the last 72 hours. BNP: No results for input(s): PROBNP in the last 72 hours. D-Dimer: No results for input(s): DDIMER in the last 72 hours. Hemoglobin A1C:  Recent Labs  10/03/15 1651  HGBA1C 5.5   Fasting Lipid Panel:  Recent Labs  10/04/15 0300  CHOL 204*  HDL 49  LDLCALC 124*  TRIG 155*  CHOLHDL 4.2   Thyroid Function Tests:  Recent Labs  10/03/15 1651  TSH 2.255     Radiology/Studies:  No results found.  PHYSICAL EXAM General: Well developed, well nourished, in no acute distress. Head: Normocephalic, atraumatic,  sclera non-icteric, oropharynx is clear Neck: Negative for carotid bruits. JVD not elevated. No adenopathy Lungs: Clear bilaterally to auscultation without wheezes, rales, or rhonchi. Breathing is unlabored. Heart: RRR S1 S2 without murmurs, rubs, or gallops.  Abdomen: Soft, non-tender, non-distended with normoactive bowel sounds. No hepatomegaly. No rebound/guarding. No obvious abdominal masses. Msk:  Strength and tone appears normal for age. Extremities: No clubbing, cyanosis or edema.  Distal pedal pulses are 2+ and equal bilaterally. Neuro: Alert and oriented X 3. Moves all extremities spontaneously. Psych:  Responds to questions appropriately with a normal affect.  ASSESSMENT AND PLAN: 1. Chest pain- atypical features. Recent false positive stress Myoview. Cardiac cath shows nonobstructive CAD. EF 50%. Echo results pending. I reviewed personally. LV function appears mildly impaired with EF appoximately 45%. The aortic root is enlarged at 4 cm. The AV is thickened and calcified and appears bicuspid. There is no significant AI or AS. Will await official report. CT chest is pending.  2. Hypercholesterolemia. On statin now.  Will follow up on CT chest. If no acute pathology can  DC home today and arrange follow up in our [place] office.   Present on Admission:  . Unstable angina (HCC) . Abnormal nuclear stress test . Chest pain  Signed, Feliciano Wynter [place], MDFACC 10/04/2015 9:54 AM

## 2015-10-04 NOTE — Care Management Obs Status (Signed)
MEDICARE OBSERVATION STATUS NOTIFICATION   Patient Details  Name: Brian Mccormick MRN: [id number] Date of Birth: May 24, 1955   Medicare Observation Status Notification Given:  Yes    CrutchfieldDerrill Memo, RN 10/04/2015, 10:56 AM

## 2015-10-04 NOTE — Progress Notes (Signed)
Pt/family given discharge instructions, medication lists, follow up appointments, and when to call the doctor.  Pt/family verbalizes understanding. Pt given signs and symptoms of infection. Marissa Lowrey McClintock, RN    

## 2015-10-16 ENCOUNTER — Ambulatory Visit (INDEPENDENT_AMBULATORY_CARE_PROVIDER_SITE_OTHER): Payer: Medicare HMO | Admitting: Cardiovascular Disease

## 2015-10-16 ENCOUNTER — Encounter: Payer: Self-pay | Admitting: Cardiovascular Disease

## 2015-10-16 VITALS — BP 111/75 | HR 70 | Ht 71.0 in | Wt 223.0 lb

## 2015-10-16 DIAGNOSIS — I429 Cardiomyopathy, unspecified: Secondary | ICD-10-CM | POA: Diagnosis not present

## 2015-10-16 DIAGNOSIS — I25118 Atherosclerotic heart disease of native coronary artery with other forms of angina pectoris: Secondary | ICD-10-CM | POA: Diagnosis not present

## 2015-10-16 DIAGNOSIS — I7121 Aneurysm of the ascending aorta, without rupture: Secondary | ICD-10-CM

## 2015-10-16 DIAGNOSIS — R079 Chest pain, unspecified: Secondary | ICD-10-CM

## 2015-10-16 DIAGNOSIS — I712 Thoracic aortic aneurysm, without rupture, unspecified: Secondary | ICD-10-CM

## 2015-10-16 MED ORDER — METOPROLOL SUCCINATE ER 25 MG PO TB24: 12.5000 mg | ORAL_TABLET | Freq: Every day | ORAL | Status: DC

## 2015-10-16 NOTE — Progress Notes (Signed)
Patient ID: Brian Mccormick, male   DOB: August 30, 1955, 60 y.o.   MRN: [id number]      SUBJECTIVE: The patient returns for follow-up after being hospitalized for chest pain. Coronary angiography on 10/03/15 showed nonobstructive coronary artery disease with a 20% proximal RCA lesion, 20% proximal circumflex lesion, and 50% ostial second diagonal lesion. He had mild ascending aortic aneurysm, 4.1 cm. Echocardiogram demonstrated mild to moderately reduced left ventricular systolic function, EF 40-45%.  He continues to have intermittent chest pains occurring up to every other day located both retrosternally and in the precordial region. He denies significant acid reflux disease. He used to experience palpitations up to a year ago but has not had any recent symptoms.   Review of Systems: As per "subjective", otherwise negative.  No Known Allergies  Current Outpatient Prescriptions  Medication Sig Dispense Refill  . aspirin EC 81 MG EC tablet Take 1 tablet (81 mg total) by mouth daily.    Marland Kitchen atorvastatin (LIPITOR) 40 MG tablet Take 1 tablet (40 mg total) by mouth daily at 6 PM. 30 tablet 6  . escitalopram (LEXAPRO) 10 MG tablet Take 10 mg by mouth every morning.  3  . HYDROcodone-acetaminophen (NORCO) 10-325 MG tablet Take 1 tablet by mouth every 4 (four) hours.  0  . omeprazole (PRILOSEC) 20 MG capsule Take 20 mg by mouth daily as needed (for indigestion/heartburn).   2  . polyethylene glycol powder (GLYCOLAX/MIRALAX) powder Mix 17 grams with 6-8 ounces of water or juice and drink once every morning  2  . traZODone (DESYREL) 100 MG tablet Take 100 mg by mouth at bedtime.  2   No current facility-administered medications for this visit.    Past Medical History  Diagnosis Date  . Abnormal nuclear stress test 09/25/15  . Racing heart beat ~ 2013  . Aortic aneurysm (HCC) dx'd ~ 2013    a. 4.1 cm ascending aortic aneurysm by CT in 09/2015  . Pneumonia 1994  . GERD (gastroesophageal reflux disease)    "only when I was taking Morphine" (10/03/2015)  . Arthritis     "hands" (10/03/2015)  . Chronic radicular pain of lower back     "goes into both legs" (10/03/2015)  . CAD (coronary artery disease)     a. cath 09/2015: 50% stenosis Ost 2nd Diag, 20% prox Cx, 20% prox RCA    Past Surgical History  Procedure Laterality Date  . Back surgery    . Shoulder arthroscopy with rotator cuff repair and open biceps tenodesis Right 03/2015    "massive biceps tear"  . Hernia repair    . Umbilical hernia repair  ~ 1978  . Lumbar laminectomy  1982  . Posterior lumbar fusion  1998    L4-5, S1  . Cervical laminectomy  1996    C5  . Spinal cord stimulator implant  2003; ~ 2015    "TENS"  . Pain pump implantation  ~ 2010    "Morphine"  . Pain pump removal  ~ 2015  . Cardiac catheterization  10/03/2015  . Cardiac catheterization N/A 10/03/2015    Procedure: Left Heart Cath and Coronary Angiography;  Surgeon: Peter M Swaziland, MD;  Location: [facility] INVASIVE CV LAB;  Service: Cardiovascular;  Laterality: N/A;    Social History   Social History  . Marital Status: Married    Spouse Name: N/A  . Number of Children: N/A  . Years of Education: N/A   Occupational History  . Not on file.   Social History  Main Topics  . Smoking status: Former Smoker -- 1.00 packs/day for 35 years    Types: Cigarettes    Start date: 04/12/1969    Quit date: 04/12/2004  . Smokeless tobacco: Never Used  . Alcohol Use: 0.0 oz/week    0 Standard drinks or equivalent per week     Comment: 10/03/2015 "nothing since 1997; never needed treatment"  . Drug Use: No  . Sexual Activity: Not Currently   Other Topics Concern  . Not on file   Social History Narrative     Filed Vitals:   10/16/15 1550  BP: 111/75  Pulse: 70  Height: 5\' 11"  (1.803 m)  Weight: 223 lb (101.152 kg)    PHYSICAL EXAM General: NAD HEENT: Normal. Neck: No JVD, no thyromegaly. Lungs: Clear to auscultation bilaterally with normal respiratory effort. CV:  Nondisplaced PMI.  Regular rate and rhythm, normal S1/S2, no S3/S4, no murmur. No pretibial or periankle edema.  No carotid bruit.   Abdomen: Soft, nontender, no distention.  Neurologic: Alert and oriented.  Psych: Normal affect. Skin: Normal. Musculoskeletal: No gross deformities.    ECG: Most recent ECG reviewed.      ASSESSMENT AND PLAN: 1. Chest pain/CAD: Nonobstructive. Will add low dose Toprol-XL 12.5 mg daily. Continue ASA and statin.  2. Cardiomyopathy/chronic systolic heart failure: No signs of heart failure. Will add low dose Toprol-XL 12.5 mg daily.  3. Ascending aortic aneurysm: Mild. Repeat CT in one year.  Dispo: fu 6 months  Time spent: 40 minutes, of which greater than 50% was spent reviewing symptoms, relevant blood tests and studies, and discussing management plan with the patient.   Prentice Docker, M.D., F.A.C.C.

## 2015-10-16 NOTE — Patient Instructions (Signed)
Medication Instructions:   Begin Toprol XL 12.5mg  daily - new sent to [facility] today.  Continue all other medications.    Labwork: NONE  Testing/Procedures: NONE  Follow-Up: Your physician wants you to follow up in: 6 months.  You will receive a reminder letter in the mail one-two months in advance.  If you don't receive a letter, please call our office to schedule the follow up appointment   Any Other Special Instructions Will Be Listed Below (If Applicable).  If you need a refill on your cardiac medications before your next appointment, please call your pharmacy.

## 2017-02-10 ENCOUNTER — Encounter: Payer: Self-pay | Admitting: Cardiovascular Disease

## 2017-02-25 ENCOUNTER — Ambulatory Visit: Payer: Medicare HMO | Admitting: Cardiovascular Disease

## 2017-02-25 ENCOUNTER — Encounter: Payer: Self-pay | Admitting: Cardiovascular Disease

## 2017-02-25 VITALS — BP 158/98 | HR 60 | Ht 71.0 in | Wt 222.0 lb

## 2017-02-25 DIAGNOSIS — I2583 Coronary atherosclerosis due to lipid rich plaque: Secondary | ICD-10-CM

## 2017-02-25 DIAGNOSIS — I429 Cardiomyopathy, unspecified: Secondary | ICD-10-CM

## 2017-02-25 DIAGNOSIS — I712 Thoracic aortic aneurysm, without rupture, unspecified: Secondary | ICD-10-CM

## 2017-02-25 DIAGNOSIS — I2510 Atherosclerotic heart disease of native coronary artery without angina pectoris: Secondary | ICD-10-CM | POA: Diagnosis not present

## 2017-02-25 DIAGNOSIS — I200 Unstable angina: Secondary | ICD-10-CM

## 2017-02-25 DIAGNOSIS — I10 Essential (primary) hypertension: Secondary | ICD-10-CM

## 2017-02-25 MED ORDER — LISINOPRIL 5 MG PO TABS: 5.0000 mg | ORAL_TABLET | Freq: Every day | ORAL | 1 refills | Status: DC

## 2017-02-25 NOTE — Patient Instructions (Addendum)
Medication Instructions:  Your physician has recommended you make the following change in your medication:    START lisinopril 5 mg daily   STOP Aspirin   Please continue all other medications as prescribed  Labwork: NONE  Testing/Procedures: NONE  Follow-Up: Your physician wants you to follow-up in: 6 MONTHS WITH DR. Leeanne Deed You will receive a reminder letter in the mail two months in advance. If you don't receive a letter, please call our office to schedule the follow-up appointment.  Any Other Special Instructions Will Be Listed Below (If Applicable).  Your physician has requested that you regularly monitor and record your blood pressure readings at home. Please use the same machine at the same time of day to check your readings and record them to bring to your follow-up visit.   If you need a refill on your cardiac medications before your next appointment, please call your pharmacy.

## 2017-02-25 NOTE — Progress Notes (Signed)
SUBJECTIVE: The patient presents for past due follow-up for chest pain, nonobstructive coronary artery disease, and cardiomyopathy/chronic systolic heart failure.  He also has an ascending aortic aneurysm.  Coronary angiography on 10/03/15 showed nonobstructive coronary artery disease with a 20% proximal RCA lesion, 20% proximal circumflex lesion, and 50% ostial second diagonal lesion. He had mild ascending aortic aneurysm, 4.1 cm. Echocardiogram demonstrated mild to moderately reduced left ventricular systolic function, EF 40-45%.  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  I initiated Toprol-XL 12.5 mg daily at his last visit.  He said he became dizzy and felt like he was going to fall and stopped taking it over a year ago.  He did not notify me of this.  He said his systolic blood pressures have been over 160 for the past several weeks.  He has shortness of breath when climbing stairs but not when walking on level ground.  He denies orthopnea, leg swelling, and paroxysmal nocturnal dyspnea.  He denies chest pain.  He occasionally has symptoms of acid reflux disease at night and takes Prilosec as needed.  Additional symptoms include back pain for which he receives steroid injections once every 6 months.  He also talks about lightheadedness when going from the sitting to standing position on occasion.  He has felt more fatigued over the last year.  He reportedly had a CT of his thoracic aorta roughly 3 weeks ago.  I will request these results.    Review of Systems: As per "subjective", otherwise negative.  No Known Allergies  Current Outpatient Medications  Medication Sig Dispense Refill  . aspirin EC 81 MG EC tablet Take 1 tablet (81 mg total) by mouth daily.    Marland Kitchen. HYDROcodone-acetaminophen (NORCO) 10-325 MG tablet Take 1 tablet by mouth every 4 (four) hours.  0  . omeprazole  (PRILOSEC) 20 MG capsule Take 20 mg by mouth daily as needed (for indigestion/heartburn).   2  . polyethylene glycol powder (GLYCOLAX/MIRALAX) powder Mix 17 grams with 6-8 ounces of water or juice and drink once every morning  2  . traZODone (DESYREL) 100 MG tablet Take 100 mg by mouth at bedtime.  2   No current facility-administered medications for this visit.     Past Medical History:  Diagnosis Date  . Abnormal nuclear stress test 09/25/15  . Aortic aneurysm (HCC) dx'd ~ 2013   a. 4.1 cm ascending aortic aneurysm by CT in 09/2015  . Arthritis    "hands" (10/03/2015)  . CAD (coronary artery disease)    a. cath 09/2015: 50% stenosis Ost 2nd Diag, 20% prox Cx, 20% prox RCA  . Chronic radicular pain of lower back    "goes into both legs" (10/03/2015)  . GERD (gastroesophageal reflux disease)    "only when I was taking Morphine" (10/03/2015)  . Pneumonia 1994  . Racing heart beat ~ 2013    Past Surgical History:  Procedure Laterality Date  . BACK SURGERY    . CARDIAC CATHETERIZATION  10/03/2015  . CARDIAC CATHETERIZATION N/A 10/03/2015   Procedure: Left Heart Cath and Coronary Angiography;  Surgeon: Peter M SwazilandJordan, MD;  [facility];  Service: Cardiovascular;  Laterality: N/A;  . CERVICAL LAMINECTOMY  1996   C5  . HERNIA REPAIR    . LUMBAR LAMINECTOMY  1982  . PAIN PUMP IMPLANTATION  ~ 2010   "Morphine"  . PAIN PUMP REMOVAL  ~  2015  . POSTERIOR LUMBAR FUSION  1998   L4-5, S1  . SHOULDER ARTHROSCOPY WITH ROTATOR CUFF REPAIR AND OPEN BICEPS TENODESIS Right 03/2015   "massive biceps tear"  . SPINAL CORD STIMULATOR IMPLANT  2003; ~ 2015   "TENS"  . UMBILICAL HERNIA REPAIR  ~ 1978    Social History   Socioeconomic History  . Marital status: Married    Spouse name: Not on file  . Number of children: Not on file  . Years of education: Not on file  . Highest education level: Not on file  Social Needs  . Financial resource strain: Not on file  . Food insecurity -  worry: Not on file  . Food insecurity - inability: Not on file  . Transportation needs - medical: Not on file  . Transportation needs - non-medical: Not on file  Occupational History  . Not on file  Tobacco Use  . Smoking status: Former Smoker    Packs/day: 1.00    Years: 35.00    Pack years: 35.00    Types: Cigarettes    Start date: 04/12/1969    Last attempt to quit: 04/12/2004    Years since quitting: 12.8  . Smokeless tobacco: Never Used  Substance and Sexual Activity  . Alcohol use: Yes    Alcohol/week: 0.0 oz    Comment: 10/03/2015 "nothing since 1997; never needed treatment"  . Drug use: No  . Sexual activity: Not Currently  Other Topics Concern  . Not on file  Social History Narrative  . Not on file     Vitals:   02/25/17 0906  BP: (!) 158/98  Pulse: 60  SpO2: 98%  Weight: 222 lb (100.7 kg)  Height: 5\' 11"  (1.803 m)    Wt Readings from Last 3 Encounters:  02/25/17 222 lb (100.7 kg)  10/16/15 223 lb (101.2 kg)  10/03/15 221 lb 8 oz (100.5 kg)     PHYSICAL EXAM General: NAD HEENT: Normal. Neck: No JVD, no thyromegaly. Lungs: Clear to auscultation bilaterally with normal respiratory effort. CV: Regular rate and rhythm, normal S1/S2, no S3/S4, no murmur. No pretibial or periankle edema.  No carotid bruit.   Abdomen: Soft, nontender, no distention.  Neurologic: Alert and oriented.  Psych: Normal affect. Skin: Normal. Musculoskeletal: No gross deformities.    ECG: Most recent ECG reviewed.   Labs: Lab Results  Component Value Date/Time   K 4.1 10/04/2015 03:00 AM   BUN 17 10/04/2015 03:00 AM   CREATININE 1.07 10/04/2015 03:00 AM   TSH 2.255 10/03/2015 04:51 PM   HGB 16.0 10/04/2015 03:00 AM     Lipids: Lab Results  Component Value Date/Time   LDLCALC 124 (H) 10/04/2015 03:00 AM   CHOL 204 (H) 10/04/2015 03:00 AM   TRIG 155 (H) 10/04/2015 03:00 AM   HDL 49 10/04/2015 03:00 AM       ASSESSMENT AND PLAN:  1. Chest pain/CAD:  Nonobstructive.  Symptomatically stable with no chest pain recurrence. Continue statin.  I will stop aspirin.  He did not tolerate low-dose Toprol-XL.  2. Cardiomyopathy/chronic systolic heart failure: No signs of heart failure.  He did not tolerate low dose Toprol-XL 12.5 mg daily, possibly due to bradycardia given his current resting heart rate of 60 bpm.  I will add lisinopril 5 mg daily given elevated blood pressure.  3. Ascending aortic aneurysm: Mild in 2017.  I will obtain a copy of the CT report from [facility] performed 3 weeks ago.  4.  Hypertension:  Blood pressure is markedly elevated.  I will add lisinopril 5 mg daily. I have asked the patient to check blood pressure readings 3 times per week, at different times throughout the day, in order to get a better approximation of mean BP values. These results will be provided to me at the end of that period so that I can determine if antihypertensive medication titration is indicated.    Disposition: Follow up 6 months   Prentice Docker, M.D., F.A.C.C.

## 2017-08-29 ENCOUNTER — Other Ambulatory Visit: Payer: Self-pay | Admitting: Cardiovascular Disease

## 2017-09-03 ENCOUNTER — Ambulatory Visit: Payer: Medicare HMO | Admitting: Cardiovascular Disease

## 2017-09-14 ENCOUNTER — Encounter: Payer: Self-pay | Admitting: Cardiovascular Disease

## 2017-09-14 ENCOUNTER — Other Ambulatory Visit: Payer: Self-pay

## 2017-09-14 ENCOUNTER — Encounter

## 2017-09-14 ENCOUNTER — Ambulatory Visit: Payer: Medicare HMO | Admitting: Cardiovascular Disease

## 2017-09-14 VITALS — BP 112/73 | HR 51 | Ht 71.0 in | Wt 216.0 lb

## 2017-09-14 DIAGNOSIS — I5022 Chronic systolic (congestive) heart failure: Secondary | ICD-10-CM | POA: Diagnosis not present

## 2017-09-14 DIAGNOSIS — I712 Thoracic aortic aneurysm, without rupture, unspecified: Secondary | ICD-10-CM

## 2017-09-14 DIAGNOSIS — I429 Cardiomyopathy, unspecified: Secondary | ICD-10-CM | POA: Diagnosis not present

## 2017-09-14 DIAGNOSIS — I2510 Atherosclerotic heart disease of native coronary artery without angina pectoris: Secondary | ICD-10-CM | POA: Diagnosis not present

## 2017-09-14 DIAGNOSIS — I10 Essential (primary) hypertension: Secondary | ICD-10-CM | POA: Diagnosis not present

## 2017-09-14 DIAGNOSIS — I2583 Coronary atherosclerosis due to lipid rich plaque: Secondary | ICD-10-CM

## 2017-09-14 NOTE — Patient Instructions (Signed)

## 2017-09-14 NOTE — Progress Notes (Signed)
SUBJECTIVE: The patient presents for routine follow-up. He has a history of coronary artery disease and chronic systolic heart failure.  He also has an ascending aortic aneurysm measured at 4.1 cm by CT angiography on 02/10/2017 at [facility]. Coronary angiography on 10/03/15 showed nonobstructive coronary artery disease with a 20% proximal RCA lesion, 20% proximal circumflex lesion, and 50% ostial second diagonal lesion. He had mild ascending aortic aneurysm, 4.1 cm. Echocardiogram demonstrated mild to moderately reduced left ventricular systolic function, EF 40-45%.  I personally reviewed the ECG performed on 02/25/2017 which demonstrated sinus rhythm with left anterior fascicular block and anteroseptal Q waves.  He did not tolerate Toprol-XL in the past as it led to dizziness and imbalance.  The patient denies any symptoms of chest pain, palpitations, shortness of breath, lightheadedness, dizziness, leg swelling, orthopnea, PND, and syncope.  His primary problems relate to bilateral carpal tunnel syndrome and arthritis of the hands as well as low back pain.  He does follow with a neurosurgeon for this.    Review of Systems: As per "subjective", otherwise negative.  No Known Allergies  Current Outpatient Medications  Medication Sig Dispense Refill  . HYDROcodone-acetaminophen (NORCO) 10-325 MG tablet Take 1 tablet by mouth every 4 (four) hours.  0  . lisinopril (PRINIVIL,ZESTRIL) 5 MG tablet TAKE 1 TABLET(5 MG) BY MOUTH DAILY 90 tablet 1  . omeprazole (PRILOSEC) 20 MG capsule Take 20 mg by mouth daily as needed (for indigestion/heartburn).   2  . polyethylene glycol powder (GLYCOLAX/MIRALAX) powder Mix 17 grams with 6-8 ounces of water or juice and drink once every morning  2  . traZODone (DESYREL) 100 MG tablet Take 100 mg by mouth at bedtime.  2   No current facility-administered medications for this visit.     Past Medical History:  Diagnosis Date  . Abnormal nuclear  stress test 09/25/15  . Aortic aneurysm (HCC) dx'd ~ 2013   a. 4.1 cm ascending aortic aneurysm by CT in 09/2015  . Arthritis    "hands" (10/03/2015)  . CAD (coronary artery disease)    a. cath 09/2015: 50% stenosis Ost 2nd Diag, 20% prox Cx, 20% prox RCA  . Chronic radicular pain of lower back    "goes into both legs" (10/03/2015)  . GERD (gastroesophageal reflux disease)    "only when I was taking Morphine" (10/03/2015)  . Pneumonia 1994  . Racing heart beat ~ 2013    Past Surgical History:  Procedure Laterality Date  . BACK SURGERY    . CARDIAC CATHETERIZATION  10/03/2015  . CARDIAC CATHETERIZATION N/A 10/03/2015   Procedure: Left Heart Cath and Coronary Angiography;  Surgeon: Peter M Swaziland, MD;  Location: [facility] INVASIVE CV LAB;  Service: Cardiovascular;  Laterality: N/A;  . CERVICAL LAMINECTOMY  1996   C5  . HERNIA REPAIR    . LUMBAR LAMINECTOMY  1982  . PAIN PUMP IMPLANTATION  ~ 2010   "Morphine"  . PAIN PUMP REMOVAL  ~ 2015  . POSTERIOR LUMBAR FUSION  1998   L4-5, S1  . SHOULDER ARTHROSCOPY WITH ROTATOR CUFF REPAIR AND OPEN BICEPS TENODESIS Right 03/2015   "massive biceps tear"  . SPINAL CORD STIMULATOR IMPLANT  2003; ~ 2015   "TENS"  . UMBILICAL HERNIA REPAIR  ~ 1978    Social History   Socioeconomic History  . Marital status: Married    Spouse name: Not on file  . Number of children: Not on file  . Years of education: Not  on file  . Highest education level: Not on file  Occupational History  . Not on file  Social Needs  . Financial resource strain: Not on file  . Food insecurity:    Worry: Not on file    Inability: Not on file  . Transportation needs:    Medical: Not on file    Non-medical: Not on file  Tobacco Use  . Smoking status: Former Smoker    Packs/day: 1.00    Years: 35.00    Pack years: 35.00    Types: Cigarettes    Start date: 04/12/1969    Last attempt to quit: 04/12/2004    Years since quitting: 13.4  . Smokeless tobacco: Never Used  Substance  and Sexual Activity  . Alcohol use: Yes    Alcohol/week: 0.0 oz    Comment: 10/03/2015 "nothing since 1997; never needed treatment"  . Drug use: No  . Sexual activity: Not Currently  Lifestyle  . Physical activity:    Days per week: Not on file    Minutes per session: Not on file  . Stress: Not on file  Relationships  . Social connections:    Talks on phone: Not on file    Gets together: Not on file    Attends religious service: Not on file    Active member of club or organization: Not on file    Attends meetings of clubs or organizations: Not on file    Relationship status: Not on file  . Intimate partner violence:    Fear of current or ex partner: Not on file    Emotionally abused: Not on file    Physically abused: Not on file    Forced sexual activity: Not on file  Other Topics Concern  . Not on file  Social History Narrative  . Not on file     Vitals:   09/14/17 0854  BP: 112/73  Pulse: (!) 51  SpO2: 96%  Weight: 216 lb (98 kg)  Height: 5\' 11"  (1.803 m)    Wt Readings from Last 3 Encounters:  09/14/17 216 lb (98 kg)  02/25/17 222 lb (100.7 kg)  10/16/15 223 lb (101.2 kg)     PHYSICAL EXAM General: NAD HEENT: Normal. Neck: No JVD, no thyromegaly. Lungs: Clear to auscultation bilaterally with normal respiratory effort. CV: Regular rate and rhythm, normal S1/S2, no S3/S4, no murmur. No pretibial or periankle edema.  No carotid bruit.   Abdomen: Soft, nontender, no distention.  Neurologic: Alert and oriented.  Psych: Normal affect. Skin: Normal. Musculoskeletal: No gross deformities.    ECG: Most recent ECG reviewed.   Labs: Lab Results  Component Value Date/Time   K 4.1 10/04/2015 03:00 AM   BUN 17 10/04/2015 03:00 AM   CREATININE 1.07 10/04/2015 03:00 AM   TSH 2.255 10/03/2015 04:51 PM   HGB 16.0 10/04/2015 03:00 AM     Lipids: Lab Results  Component Value Date/Time   LDLCALC 124 (H) 10/04/2015 03:00 AM   CHOL 204 (H) 10/04/2015 03:00 AM     TRIG 155 (H) 10/04/2015 03:00 AM   HDL 49 10/04/2015 03:00 AM       ASSESSMENT AND PLAN: 1.  Coronary artery disease: Symptomatically stable.  This was nonobstructive when assessed by coronary angiography in 2017 as noted above.  I will obtain a copy of lipids from PCP.  He did not tolerate beta-blockers.  2. Cardiomyopathy/chronic systolic heart failure: Symptom medically stable.  He did not tolerate beta-blockers.  He is bradycardic.  He is on lisinopril 5 mg.  I will obtain a copy of labs from PCP including TSH if performed.  3. Ascending aortic aneurysm: This was mild in severity when last assessed on 02/10/2017 (4.1 cm) as detailed above.  I will monitor.  4.  Hypertension: Blood pressure is controlled.  No changes to therapy.   Disposition: Follow up 1 year  Time spent: 40 minutes, of which greater than 50% was spent reviewing symptoms, relevant blood tests and studies, and discussing management plan with the patient.    Prentice Docker, M.D., F.A.C.C.

## 2017-10-16 ENCOUNTER — Ambulatory Visit: Payer: Medicare HMO | Admitting: Cardiovascular Disease

## 2017-11-23 ENCOUNTER — Ambulatory Visit: Payer: Medicare HMO | Admitting: Cardiovascular Disease

## 2018-02-28 HISTORY — PX: ROTATOR CUFF REPAIR: SHX139

## 2018-08-04 ENCOUNTER — Telehealth: Payer: Self-pay | Admitting: Cardiovascular Disease

## 2018-08-04 NOTE — Telephone Encounter (Signed)
Virtual Visit Pre-Appointment Phone Call  "(Name), I am calling you today to discuss your upcoming appointment. We are currently trying to limit exposure to the virus that causes COVID-19 by seeing patients at home rather than in the office."  1. "What is the BEST phone number to call the day of the visit?" - include this in appointment notes  2. Do you have or have access to (through a family member/friend) a smartphone with video capability that we can use for your visit?" a. If yes - list this number in appt notes as cell (if different from BEST phone #) and list the appointment type as a VIDEO visit in appointment notes b. If no - list the appointment type as a PHONE visit in appointment notes  3. Confirm consent - "In the setting of the current Covid19 crisis, you are scheduled for a (phone or video) visit with your provider on (date) at (time).  Just as we do with many in-office visits, in order for you to participate in this visit, we must obtain consent.  If you'd like, I can send this to your mychart (if signed up) or email for you to review.  Otherwise, I can obtain your verbal consent now.  All virtual visits are billed to your insurance company just like a normal visit would be.  By agreeing to a virtual visit, we'd like you to understand that the technology does not allow for your provider to perform an examination, and thus may limit your provider's ability to fully assess your condition. If your provider identifies any concerns that need to be evaluated in person, we will make arrangements to do so.  Finally, though the technology is pretty good, we cannot assure that it will always work on either your or our end, and in the setting of a video visit, we may have to convert it to a phone-only visit.  In either situation, we cannot ensure that we have a secure connection.  Are you willing to proceed?" STAFF: Did the patient verbally acknowledge consent to telehealth visit? Document  YES/NO here: yes  4. Advise patient to be prepared - "Two hours prior to your appointment, go ahead and check your blood pressure, pulse, oxygen saturation, and your weight (if you have the equipment to check those) and write them all down. When your visit starts, your provider will ask you for this information. If you have an Apple Watch or Kardia device, please plan to have heart rate information ready on the day of your appointment. Please have a pen and paper handy nearby the day of the visit as well."  5. Give patient instructions for MyChart download to smartphone OR Doximity/Doxy.me as below if video visit (depending on what platform provider is using)  6. Inform patient they will receive a phone call 15 minutes prior to their appointment time (may be from unknown caller ID) so they should be prepared to answer    TELEPHONE CALL NOTE  Brian Mccormick. has been deemed a candidate for a follow-up tele-health visit to limit community exposure during the Covid-19 pandemic. I spoke with the patient via phone to ensure availability of phone/video source, confirm preferred email & phone number, and discuss instructions and expectations.  I reminded Brian Saka Certain Jr. to be prepared with any vital sign and/or heart rhythm information that could potentially be obtained via home monitoring, at the time of his visit. I reminded Brian Beat Waln Jr. to expect a phone  call prior to his visit.  Geraldine Contras 08/04/2018 2:16 PM   INSTRUCTIONS FOR DOWNLOADING THE MYCHART APP TO SMARTPHONE  - The patient must first make sure to have activated MyChart and know their login information - If Apple, go to [employer] and type in MyChart in the search bar and download the app. If Android, ask patient to go to [employer] and type in [place] in the search bar and download the app. The app is free but as with any other app downloads, their phone may require them to verify saved payment information or  Apple/Android password.  - The patient will need to then log into the app with their MyChart username and password, and select [place] as their healthcare provider to link the account. When it is time for your visit, go to the MyChart app, find appointments, and click Begin Video Visit. Be sure to Select Allow for your device to access the Microphone and Camera for your visit. You will then be connected, and your provider will be with you shortly.  **If they have any issues connecting, or need assistance please contact MyChart service desk [phone])**  **If using a computer, in order to ensure the best quality for their visit they will need to use either of the following Internet Browsers: [employer] or Google Chrome**  IF USING DOXIMITY or DOXY.ME - The patient will receive a link just prior to their visit by text.     FULL LENGTH CONSENT FOR TELE-HEALTH VISIT   I hereby voluntarily request, consent and authorize [facility] and its employed or contracted physicians, physician assistants, nurse practitioners or other licensed health care professionals (the Practitioner), to provide me with telemedicine health care services (the Services") as deemed necessary by the treating Practitioner. I acknowledge and consent to receive the Services by the Practitioner via telemedicine. I understand that the telemedicine visit will involve communicating with the Practitioner through live audiovisual communication technology and the disclosure of certain medical information by electronic transmission. I acknowledge that I have been given the opportunity to request an in-person assessment or other available alternative prior to the telemedicine visit and am voluntarily participating in the telemedicine visit.  I understand that I have the right to withhold or withdraw my consent to the use of telemedicine in the course of my care at any time, without affecting my right to future care  or treatment, and that the Practitioner or I may terminate the telemedicine visit at any time. I understand that I have the right to inspect all information obtained and/or recorded in the course of the telemedicine visit and may receive copies of available information for a reasonable fee.  I understand that some of the potential risks of receiving the Services via telemedicine include:   Delay or interruption in medical evaluation due to technological equipment failure or disruption;  Information transmitted may not be sufficient (e.g. poor resolution of images) to allow for appropriate medical decision making by the Practitioner; and/or   In rare instances, security protocols could fail, causing a breach of personal health information.  Furthermore, I acknowledge that it is my responsibility to provide information about my medical history, conditions and care that is complete and accurate to the best of my ability. I acknowledge that Practitioner's advice, recommendations, and/or decision may be based on factors not within their control, such as incomplete or inaccurate data provided by me or distortions of diagnostic images or specimens that may result from  electronic transmissions. I understand that the practice of medicine is not an exact science and that Practitioner makes no warranties or guarantees regarding treatment outcomes. I acknowledge that I will receive a copy of this consent concurrently upon execution via email to the email address I last provided but may also request a printed copy by calling the office of [place].    I understand that my insurance will be billed for this visit.   I have read or had this consent read to me.  I understand the contents of this consent, which adequately explains the benefits and risks of the Services being provided via telemedicine.   I have been provided ample opportunity to ask questions regarding this consent and the Services and have had  my questions answered to my satisfaction.  I give my informed consent for the services to be provided through the use of telemedicine in my medical care  By participating in this telemedicine visit I agree to the above.

## 2018-08-05 ENCOUNTER — Encounter: Payer: Self-pay | Admitting: *Deleted

## 2018-08-05 ENCOUNTER — Telehealth (INDEPENDENT_AMBULATORY_CARE_PROVIDER_SITE_OTHER): Payer: Medicare HMO | Admitting: Cardiovascular Disease

## 2018-08-05 ENCOUNTER — Encounter: Payer: Self-pay | Admitting: Cardiovascular Disease

## 2018-08-05 VITALS — BP 132/82 | HR 51 | Ht 71.0 in | Wt 202.0 lb

## 2018-08-05 DIAGNOSIS — I2583 Coronary atherosclerosis due to lipid rich plaque: Secondary | ICD-10-CM

## 2018-08-05 DIAGNOSIS — I10 Essential (primary) hypertension: Secondary | ICD-10-CM

## 2018-08-05 DIAGNOSIS — R001 Bradycardia, unspecified: Secondary | ICD-10-CM

## 2018-08-05 DIAGNOSIS — I2510 Atherosclerotic heart disease of native coronary artery without angina pectoris: Secondary | ICD-10-CM

## 2018-08-05 DIAGNOSIS — I429 Cardiomyopathy, unspecified: Secondary | ICD-10-CM

## 2018-08-05 DIAGNOSIS — I712 Thoracic aortic aneurysm, without rupture, unspecified: Secondary | ICD-10-CM

## 2018-08-05 DIAGNOSIS — I5022 Chronic systolic (congestive) heart failure: Secondary | ICD-10-CM

## 2018-08-05 NOTE — Patient Instructions (Addendum)
Medication Instructions:  Continue all current medications.  Labwork: Will request most recent from primary physician.  If Lipids or TSH (thyroid) not done, office will let you know.    Testing/Procedures:  Your physician has requested that you have an echocardiogram. Echocardiography is a painless test that uses sound waves to create images of your heart. It provides your doctor with information about the size and shape of your heart and how well your heart's chambers and valves are working. This procedure takes approximately one hour. There are no restrictions for this procedure.  (This can be done in July or later.)    Office will contact with results via phone or letter.    Follow-Up: Your physician wants you to follow up in:  1 year.  You will receive a reminder letter in the mail one-two months in advance.  If you don't receive a letter, please call our office to schedule the follow up appointment   Any Other Special Instructions Will Be Listed Below (If Applicable).  If you need a refill on your cardiac medications before your next appointment, please call your pharmacy.

## 2018-08-05 NOTE — Progress Notes (Signed)
Virtual Visit via Video Note (ultimately converted to phone due to multiple technical issues)   This visit type was conducted due to national recommendations for restrictions regarding the COVID-19 Pandemic (e.g. social distancing) in an effort to limit this patient's exposure and mitigate transmission in our community.  Due to his co-morbid illnesses, this patient is at least at moderate risk for complications without adequate follow up.  This format is felt to be most appropriate for this patient at this time.  All issues noted in this document were discussed and addressed.  A limited physical exam was performed with this format.  Please refer to the patient's chart for his consent to telehealth for [facility].   Date:  08/05/2018   ID:  Brian Beat Deloria Jr., DOB [birth year], MRN [id number]  Patient Location: Home Provider Location: Office  PCP:  Kirstie Peri, MD  Cardiologist:  No primary care provider on file.  Electrophysiologist:  None   Evaluation Performed:  Follow-Up Visit  Chief Complaint:  CAD  History of Present Illness:    Brian Bolam Verdone Montez Hageman. is a 63 y.o. male with a history of coronary artery disease and chronic systolic heart failure.  He also has an ascending aortic aneurysm measured at 4.1 cm by CT angiography on 02/10/2017 at [facility]. Coronary angiography on 10/03/15 showed nonobstructive coronary artery disease with a 20% proximal RCA lesion, 20% proximal circumflex lesion, and 50% ostial second diagonal lesion. He had mild ascending aortic aneurysm, 4.1 cm. Echocardiogram demonstrated mild to moderately reduced left ventricular systolic function, EF 40-45%.  He has been doing fairly well overall.  He denies exertional chest pain and dyspnea.  He denies leg swelling, orthopnea, palpitations, and paroxysmal nocturnal dyspnea.  Last week he had some dizziness when going from the sitting to standing position.  He has not been checking his blood pressure and heart rate  routinely.  His energy levels are good overall.  The patient does not have symptoms concerning for COVID-19 infection (fever, chills, cough, or new shortness of breath).    Past Medical History:  Diagnosis Date  . Abnormal nuclear stress test 09/25/15  . Aortic aneurysm (HCC) dx'd ~ 2013   a. 4.1 cm ascending aortic aneurysm by CT in 09/2015  . Arthritis    "hands" (10/03/2015)  . CAD (coronary artery disease)    a. cath 09/2015: 50% stenosis Ost 2nd Diag, 20% prox Cx, 20% prox RCA  . Chronic radicular pain of lower back    "goes into both legs" (10/03/2015)  . GERD (gastroesophageal reflux disease)    "only when I was taking Morphine" (10/03/2015)  . Pneumonia 1994  . Racing heart beat ~ 2013   Past Surgical History:  Procedure Laterality Date  . BACK SURGERY    . CARDIAC CATHETERIZATION  10/03/2015  . CARDIAC CATHETERIZATION N/A 10/03/2015   Procedure: Left Heart Cath and Coronary Angiography;  Surgeon: Peter M Swaziland, MD;  Location: [facility] INVASIVE CV LAB;  Service: Cardiovascular;  Laterality: N/A;  . CERVICAL LAMINECTOMY  1996   C5  . HERNIA REPAIR    . LUMBAR LAMINECTOMY  1982  . PAIN PUMP IMPLANTATION  ~ 2010   "Morphine"  . PAIN PUMP REMOVAL  ~ 2015  . POSTERIOR LUMBAR FUSION  1998   L4-5, S1  . SHOULDER ARTHROSCOPY WITH ROTATOR CUFF REPAIR AND OPEN BICEPS TENODESIS Right 03/2015   "massive biceps tear"  . SPINAL CORD STIMULATOR IMPLANT  2003; ~ 2015   "TENS"  .  UMBILICAL HERNIA REPAIR  ~ 1978     Current Meds  Medication Sig  . HYDROcodone-acetaminophen (NORCO) 10-325 MG tablet Take 1 tablet by mouth every 4 (four) hours.  Marland Kitchen. lisinopril (PRINIVIL,ZESTRIL) 5 MG tablet TAKE 1 TABLET(5 MG) BY MOUTH DAILY  . omeprazole (PRILOSEC) 20 MG capsule Take 20 mg by mouth daily as needed (for indigestion/heartburn).   . polyethylene glycol powder (GLYCOLAX/MIRALAX) powder Mix 17 grams with 6-8 ounces of water or juice and drink once every morning  . traZODone (DESYREL) 100 MG tablet Take  100 mg by mouth at bedtime.     Allergies:   Patient has no known allergies.   Social History   Tobacco Use  . Smoking status: Former Smoker    Packs/day: 1.00    Years: 35.00    Pack years: 35.00    Types: Cigarettes    Start date: 04/12/1969    Last attempt to quit: 04/12/2004    Years since quitting: 14.3  . Smokeless tobacco: Never Used  Substance Use Topics  . Alcohol use: Yes    Alcohol/week: 0.0 standard drinks    Comment: 10/03/2015 "nothing since 1997; never needed treatment"  . Drug use: No     Family Hx: The patient's family history includes Heart attack in his father.  ROS:   Please see the history of present illness.     All other systems reviewed and are negative.   Prior CV studies:   The following studies were reviewed today:  See above  Labs/Other Tests and Data Reviewed:    EKG:  No ECG reviewed.  Recent Labs: No results found for requested labs within last 8760 hours.   Recent Lipid Panel Lab Results  Component Value Date/Time   CHOL 204 (H) 10/04/2015 03:00 AM   TRIG 155 (H) 10/04/2015 03:00 AM   HDL 49 10/04/2015 03:00 AM   CHOLHDL 4.2 10/04/2015 03:00 AM   LDLCALC 124 (H) 10/04/2015 03:00 AM    Wt Readings from Last 3 Encounters:  08/05/18 202 lb (91.6 kg)  09/14/17 216 lb (98 kg)  02/25/17 222 lb (100.7 kg)     Objective:    Vital Signs:  BP 132/82   Pulse (!) 51   Ht 5\' 11"  (1.803 m)   Wt 202 lb (91.6 kg)   BMI 28.17 kg/m    VITAL SIGNS:  reviewed  ASSESSMENT & PLAN:    1. Coronary artery disease: Symptomatically stable.  This was nonobstructive when assessed by coronary angiography in 2017 as noted above.  I will obtain a copy of lipids from PCP.  He did not tolerate beta-blockers due to bradycardia (51 bpm today).  2. Cardiomyopathy/chronic systolic heart failure: Symptomatically stable.  He did not tolerate beta-blockers.  He is on lisinopril 5 mg. I will obtain a follow-up echocardiogram to assess for interval  changes in cardiac structure and function.  3. Ascending aortic aneurysm: This was mild in severity when last assessed on 02/10/2017 (4.1 cm) as detailed above.  I will obtain an echocardiogram to start.  4.  Hypertension: Blood pressure is controlled.  No changes to therapy.  5. Bradycardia: 51 bpm today.  I will obtain a copy of labs from PCP including TSH.  I asked him to monitor his pulse routinely and to notify me if it dips into the 40 or 30 bpm range.  He denies syncope.   COVID-19 Education: The signs and symptoms of COVID-19 were discussed with the patient and how to seek care  for testing (follow up with PCP or arrange E-visit).  The importance of social distancing was discussed today.  Time:   Today, I have spent 25 minutes with the patient with telehealth technology discussing the above problems.     Medication Adjustments/Labs and Tests Ordered: Current medicines are reviewed at length with the patient today.  Concerns regarding medicines are outlined above.   Tests Ordered: No orders of the defined types were placed in this encounter.   Medication Changes: No orders of the defined types were placed in this encounter.   Disposition:  Follow up in 1 year(s)  Signed, Prentice Docker, MD  08/05/2018 8:50 AM    [facility]

## 2018-08-05 NOTE — Addendum Note (Signed)
Addended by: Lesle Chris on: 08/05/2018 11:11 AM   Modules accepted: Orders

## 2018-08-16 ENCOUNTER — Telehealth: Payer: Self-pay | Admitting: *Deleted

## 2018-08-16 ENCOUNTER — Other Ambulatory Visit: Payer: Self-pay | Admitting: *Deleted

## 2018-08-16 MED ORDER — ROSUVASTATIN CALCIUM 5 MG PO TABS: 5.0000 mg | ORAL_TABLET | Freq: Every day | ORAL | 6 refills | Status: DC

## 2018-08-16 MED ORDER — MELOXICAM 15 MG PO TABS: 15.0000 mg | ORAL_TABLET | Freq: Every day | ORAL | Status: DC

## 2018-08-16 NOTE — Telephone Encounter (Signed)
Notes recorded by Lesle Chris, LPN on 8/84/1660 at 11:29 AM EDT Wife (Jan) notified. New medication sent to pharmacy today. ------  Notes recorded by Lesle Chris, LPN on 09/28/1599 at 11:57 AM EDT Left message to return call.  ------  Notes recorded by Laqueta Linden, MD on 08/09/2018 at 9:51 AM EDT Would recommend a trial of Crestor 5 mg daily with repeat lipids in 3 months.

## 2018-08-16 NOTE — Telephone Encounter (Signed)
Wife (Jan) states that he does c/o tiredness & dizziness occasionally.  Echo is scheduled for July, but will see if can move up.  Also, recommend logging bp readings to see what his normal trend is & if c/o dizziness with changing positions to log as well.  Check for 10-14 days & if consistently dipping with position changes to let office know.  She verbalized understanding.

## 2018-08-18 ENCOUNTER — Telehealth: Payer: Self-pay | Admitting: Cardiovascular Disease

## 2018-08-18 NOTE — Telephone Encounter (Signed)

## 2018-08-19 ENCOUNTER — Other Ambulatory Visit: Payer: Self-pay

## 2018-08-19 ENCOUNTER — Ambulatory Visit (INDEPENDENT_AMBULATORY_CARE_PROVIDER_SITE_OTHER): Payer: Medicare HMO

## 2018-08-19 DIAGNOSIS — I429 Cardiomyopathy, unspecified: Secondary | ICD-10-CM | POA: Diagnosis not present

## 2018-08-24 ENCOUNTER — Telehealth: Payer: Self-pay | Admitting: *Deleted

## 2018-08-24 NOTE — Telephone Encounter (Signed)
Pt aware and voiced understanding - routed to pcp  

## 2018-08-24 NOTE — Telephone Encounter (Signed)
-----   Message from Antoine Poche, MD sent at 08/20/2018  4:14 PM EDT ----- Echo looks good, heart function has improved over time and actually has returned to normal  Dominga Ferry MD

## 2018-09-29 ENCOUNTER — Other Ambulatory Visit: Payer: Medicare HMO

## 2018-11-29 NOTE — Progress Notes (Signed)
Office Visit Note  Brian Mccormick: Brian Sherk Janvier Jr.             Date of Birth: September 02, 1955           MRN: [id number]             PCP: Monico Blitz, MD Referring: Vevelyn Francois, MD Visit Date: 12/13/2018 Occupation: Retired, Database administrator  Subjective:  Pain and swelling in both hands.   History of Present Illness: Brian Hymon Dutko Brooke Bonito. is a 63 y.o. male seen in consultation per request of Dr. Sander Radon (pain specialist).  According to the Brian Mccormick he has had longstanding history of degenerative disease of cervical and lumbar spine for which he has had cervical spine laminectomy and lumbar spine fusion.  He has chronic neck and lower back pain.  He is also had shoulder joint surgery on bilateral shoulders for biceps tendon repair and rotator cuff tear repair he has chronic pain in his both shoulders.  He states about 2 years ago he started having pain and swelling over his right third MCP joint which moved to his right second MCP joint.  Last year he started having pain and swelling in his left second MCP joint.  He gives history of significant morning stiffness and difficulty making a complete fist.  He has noticed decreased grip strength.  He has been also experiencing numbness and does fingers.  He complains of some discomfort in his right elbow and his left knee.  Besides his hands he has not had swelling in any other joints.  He denies any discomfort in his feet.  He believes that his father and his paternal grandmother had rheumatoid arthritis as their hands were very swollen.  Activities of Daily Living:  Brian Mccormick reports morning stiffness for several hours.   Brian Mccormick Denies nocturnal pain.  Difficulty dressing/grooming: Reports Difficulty climbing stairs: Denies Difficulty getting out of chair: Denies Difficulty using hands for taps, buttons, cutlery, and/or writing: Reports  Review of Systems  Constitutional: Positive for fatigue. Negative for night sweats.  HENT: Positive for mouth  dryness. Negative for mouth sores, trouble swallowing, trouble swallowing and nose dryness.   Eyes: Negative for redness, itching and dryness.  Respiratory: Negative for shortness of breath and difficulty breathing.   Cardiovascular: Negative for chest pain, palpitations, hypertension, irregular heartbeat and swelling in legs/feet.  Gastrointestinal: Negative for abdominal pain, blood in stool, constipation and diarrhea.  Endocrine: Negative for increased urination.  Genitourinary: Negative for difficulty urinating and painful urination.  Musculoskeletal: Positive for arthralgias, joint pain, joint swelling and morning stiffness. Negative for myalgias, muscle weakness, muscle tenderness and myalgias.  Skin: Negative for color change, rash, hair loss, nodules/bumps, redness, skin tightness, ulcers and sensitivity to sunlight.  Allergic/Immunologic: Negative for susceptible to infections.  Neurological: Positive for numbness and weakness. Negative for dizziness, fainting, headaches, memory loss and night sweats.  Hematological: Negative for bruising/bleeding tendency and swollen glands.  Psychiatric/Behavioral: Negative for depressed mood, confusion and sleep disturbance. The Brian Mccormick is not nervous/anxious.     PMFS History:  Brian Mccormick Active Problem List   Diagnosis Date Noted  . DDD (degenerative disc disease), cervical 12/13/2018  . DDD (degenerative disc disease), lumbar 12/13/2018  . Essential hypertension 12/13/2018  . History of aortic aneurysm 12/13/2018  . History of gastroesophageal reflux (GERD) 12/13/2018  . Primary osteoarthritis of both hands 12/13/2018  . Unstable angina (Accident) 10/03/2015  . Abnormal nuclear stress test 10/03/2015  . Chest pain 10/03/2015    Past Medical History:  Diagnosis Date  . Abnormal nuclear stress test 09/25/15  . Aortic aneurysm ([place]) dx'd ~ 2013   a. 4.1 cm ascending aortic aneurysm by CT in 09/2015  . Arthritis    "hands" (10/03/2015)  . CAD  (coronary artery disease)    a. cath 09/2015: 50% stenosis Ost 2nd Diag, 20% prox Cx, 20% prox RCA  . Chronic radicular pain of lower back    "goes into both legs" (10/03/2015)  . GERD (gastroesophageal reflux disease)    "only when I was taking Morphine" (10/03/2015)  . Pneumonia 1994  . Racing heart beat ~ 2013    Family History  Problem Relation Age of Onset  . Heart attack Father   . Arthritis Father   . Arthritis Paternal Grandmother   . Healthy Son    Past Surgical History:  Procedure Laterality Date  . BACK SURGERY    . CARDIAC CATHETERIZATION  10/03/2015  . CARDIAC CATHETERIZATION N/A 10/03/2015   Procedure: Left Heart Cath and Coronary Angiography;  Surgeon: Peter M Martinique, MD;  Location: [facility];  Service: Cardiovascular;  Laterality: N/A;  . CERVICAL LAMINECTOMY  1996   C5  . HERNIA REPAIR    . LUMBAR LAMINECTOMY  1982  . PAIN PUMP IMPLANTATION  ~ 2010   "Morphine"  . PAIN PUMP REMOVAL  ~ 2015  . POSTERIOR LUMBAR FUSION  1998   L4-5, S1  . ROTATOR CUFF REPAIR Left 02/2018  . SHOULDER ARTHROSCOPY WITH ROTATOR CUFF REPAIR AND OPEN BICEPS TENODESIS Right 03/2015   "massive biceps tear"  . SPINAL CORD STIMULATOR IMPLANT  2003; ~ 2015   "TENS"  . UMBILICAL HERNIA REPAIR  ~ 39   Social History   Social History Narrative  . Not on file    There is no immunization history on file for this Brian Mccormick.   Objective: Vital Signs: BP (!) 175/88 (BP Location: Right Arm, Brian Mccormick Position: Sitting, Cuff Size: Normal)   Pulse (!) 53   Resp 15   Ht 5' 11"  (1.803 m)   Wt 207 lb 9.6 oz (94.2 kg)   BMI 28.95 kg/m    Physical Exam Vitals signs and nursing note reviewed.  Constitutional:      Appearance: He is well-developed.  HENT:     Head: Normocephalic and atraumatic.  Eyes:     Conjunctiva/sclera: Conjunctivae normal.     Pupils: Pupils are equal, round, and reactive to light.  Neck:     Musculoskeletal: Normal range of motion and neck supple.   Cardiovascular:     Rate and Rhythm: Normal rate and regular rhythm.     Heart sounds: Normal heart sounds.  Pulmonary:     Effort: Pulmonary effort is normal.     Breath sounds: Normal breath sounds.  Abdominal:     General: Bowel sounds are normal.     Palpations: Abdomen is soft.  Skin:    General: Skin is warm and dry.     Capillary Refill: Capillary refill takes less than 2 seconds.  Neurological:     Mental Status: He is alert and oriented to person, place, and time.  Psychiatric:        Behavior: Behavior normal.      Musculoskeletal Exam: Cervical and lumbar spine had limited range of motion with a lot of discomfort.  Shoulder joints had painful range of motion.  He had tenderness over right medial epicondyle.  He had good range of motion of his wrist joints.  He has  incomplete fist formation bilaterally.  He has thickening of the right second and third MCP joints with no obvious synovitis.  Left second MCP thickening with no synovitis was noted.  Prominence of PIP and DIP joints consistent with osteoarthritis was noted.  Hip joints with good range of motion.  Knee joints are in good range of motion.  He had discomfort range of motion of his left knee joint.  Ankle joints MTPs PIPs with good range of motion with no synovitis.  CDAI Exam: CDAI Score: - Brian Mccormick Global: -; Provider Global: - Swollen: -; Tender: - Joint Exam   No joint exam has been documented for this visit   There is currently no information documented on the homunculus. Go to the Rheumatology activity and complete the homunculus joint exam.  Investigation: No additional findings.  Imaging: Xr Hand 2 View Left  Result Date: 12/13/2018 PIP DIP and CMC joint narrowing was noted.  Severe second and third MCP joint with hanging osteophyte and possible chondrocalcinosis at the third MCP joint was noted. 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  Xr Hand 2 View Right  Result Date: 12/13/2018 Severe CMC narrowing and subluxation was noted.  PIP and DIP narrowing was noted.  Severe narrowing of second and third MCP joint with subluxation of third MCP joint and hanging osteophyte was noted.  Some calcification around the joint was noted. Impression: These findings are consistent with osteoarthritis and inflammatory arthritis most likely due to chondrocalcinosis.  Xr Knee 3 View Left  Result Date: 12/13/2018 Mild to moderate medial compartment narrowing was noted.  Chondrocalcinosis was noted.  Mild patellofemoral narrowing was noted. Impression: These findings are consistent with moderate osteoarthritis, mild chondromalacia patella and chondrocalcinosis.   Recent Labs: Lab Results  Component Value Date   WBC 6.1 10/04/2015   HGB 16.0 10/04/2015   PLT 215 10/04/2015   NA 137 10/04/2015   K 4.1 10/04/2015   CL 109 10/04/2015   CO2 23 10/04/2015   GLUCOSE 94 10/04/2015   BUN 17 10/04/2015   CREATININE 1.07 10/04/2015   CALCIUM 8.9 10/04/2015   GFRAA >60 10/04/2015    November 18, 2018 RNP negative, Sm negative, RF negative, ESR 6, CRP 1.2 elevated  Speciality Comments: No specialty comments available.  Procedures:  No procedures performed Allergies: Brian Mccormick has no known allergies.   Assessment / Plan:     Visit Diagnoses: Pain in both hands -Brian Mccormick gives history of pain and discomfort in his bilateral hands over last 1 to 2 years.  He has incomplete fist formation and significant discomfort.  He has a stiffness lasting all day.  He had thickening of his bilateral second MCP and right third MCP joint.  Prominence of PIP and DIP joints was also noted.  Plan: XR Hand 2 View Right, XR Hand 2 View Left.  The x-rays were consistent with chondrocalcinosis and osteoarthritis.  These findings can also be seen in rheumatoid arthritis.  But he does not have any erosive changes and no juxta-articular osteopenia.  My plan  is to start him on colchicine once we have some baseline labs available.  He has not improved fist formation due to severe osteoarthritis.  I have given a handout on hand exercises.  Primary osteoarthritis of both hands-he has underlying osteoarthritis involving his hands involving PIP and DIP joints.  Chronic pain of left knee -he complains of discomfort in his left knee joint.  He describes pain mostly over popliteal  region.  No no warmth swelling or effusion was noted.  Plan: XR KNEE 3 VIEW LEFT.  The x-ray of the knee joint showed moderate osteoarthritis, mild chondromalacia patella and chondrocalcinosis.  Medial epicondylitis of right elbow-he had tenderness over medial epicondyle.  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  Most likely the discomfort is coming from repeated motion.  A handout on exercises was given.  Chronic left shoulder pain -  status post rotator cuff tear repair and biceps tendon repair.  He continues to have discomfort in his bilateral shoulders.  DDD (degenerative disc disease), cervical - status post laminectomy.  He has chronic discomfort and is followed at pain clinic.  DDD (degenerative disc disease), lumbar - Status post fusion.  Brian Mccormick complains of chronic lumbar spine discomfort.  He is on pain medications and has a stim unit.  He describes some right-sided radiculopathy.  Opiate use - Norco 10-325 mg every 4 hours   Chronic pain syndrome  Essential hypertension-his blood pressure reading was elevated today.  I have advised him to follow-up with his PCP for better control of his blood pressure.  History of aortic aneurysm - thoracic  History of gastroesophageal reflux (GERD)  Orders: Orders Placed This Encounter  Procedures  . XR Hand 2 View Right  . XR Hand 2 View Left  . XR KNEE 3 VIEW LEFT  . Uric acid  . Cyclic citrul peptide antibody, IgG  . 14-3-3 eta Protein  . CBC with Differential/Platelet  . COMPLETE METABOLIC PANEL WITH GFR   . Magnesium  . Iron, TIBC and Ferritin Panel   No orders of the defined types were placed in this encounter.   Face-to-face time spent with Brian Mccormick was 45 minutes. Greater than 50% of time was spent in counseling and coordination of care.  Follow-Up Instructions: Return in about 2 weeks (around 12/27/2018) for Osteoarthritis, chondrocalcinosis.   Bo Merino, MD  Note - This record has been created using Editor, commissioning.  Chart creation errors have been sought, but may not always  have been located. Such creation errors do not reflect on  the standard of medical care.

## 2018-11-30 ENCOUNTER — Encounter: Payer: Self-pay | Admitting: *Deleted

## 2018-11-30 ENCOUNTER — Other Ambulatory Visit: Payer: Self-pay | Admitting: *Deleted

## 2018-11-30 DIAGNOSIS — I2510 Atherosclerotic heart disease of native coronary artery without angina pectoris: Secondary | ICD-10-CM

## 2018-12-13 ENCOUNTER — Other Ambulatory Visit: Payer: Self-pay

## 2018-12-13 ENCOUNTER — Ambulatory Visit: Payer: Self-pay

## 2018-12-13 ENCOUNTER — Encounter: Payer: Self-pay | Admitting: Rheumatology

## 2018-12-13 ENCOUNTER — Ambulatory Visit (INDEPENDENT_AMBULATORY_CARE_PROVIDER_SITE_OTHER): Payer: Medicare HMO

## 2018-12-13 ENCOUNTER — Ambulatory Visit: Payer: Medicare HMO | Admitting: Rheumatology

## 2018-12-13 VITALS — BP 175/88 | HR 53 | Resp 15 | Ht 71.0 in | Wt 207.6 lb

## 2018-12-13 DIAGNOSIS — M25562 Pain in left knee: Secondary | ICD-10-CM | POA: Diagnosis not present

## 2018-12-13 DIAGNOSIS — M19041 Primary osteoarthritis, right hand: Secondary | ICD-10-CM

## 2018-12-13 DIAGNOSIS — M79641 Pain in right hand: Secondary | ICD-10-CM

## 2018-12-13 DIAGNOSIS — M79642 Pain in left hand: Secondary | ICD-10-CM

## 2018-12-13 DIAGNOSIS — F1190 Opioid use, unspecified, uncomplicated: Secondary | ICD-10-CM

## 2018-12-13 DIAGNOSIS — G8929 Other chronic pain: Secondary | ICD-10-CM | POA: Diagnosis not present

## 2018-12-13 DIAGNOSIS — Z8679 Personal history of other diseases of the circulatory system: Secondary | ICD-10-CM | POA: Insufficient documentation

## 2018-12-13 DIAGNOSIS — M25512 Pain in left shoulder: Secondary | ICD-10-CM

## 2018-12-13 DIAGNOSIS — M7701 Medial epicondylitis, right elbow: Secondary | ICD-10-CM

## 2018-12-13 DIAGNOSIS — Z5181 Encounter for therapeutic drug level monitoring: Secondary | ICD-10-CM

## 2018-12-13 DIAGNOSIS — M19042 Primary osteoarthritis, left hand: Secondary | ICD-10-CM

## 2018-12-13 DIAGNOSIS — Z8719 Personal history of other diseases of the digestive system: Secondary | ICD-10-CM

## 2018-12-13 DIAGNOSIS — M5136 Other intervertebral disc degeneration, lumbar region: Secondary | ICD-10-CM

## 2018-12-13 DIAGNOSIS — G894 Chronic pain syndrome: Secondary | ICD-10-CM

## 2018-12-13 DIAGNOSIS — M5030 Other cervical disc degeneration, unspecified cervical region: Secondary | ICD-10-CM

## 2018-12-13 DIAGNOSIS — I10 Essential (primary) hypertension: Secondary | ICD-10-CM

## 2018-12-13 NOTE — Patient Instructions (Signed)
Elbow and Forearm Exercises Ask your health care provider which exercises are safe for you. Do exercises exactly as told by your health care provider and adjust them as directed. It is normal to feel mild stretching, pulling, tightness, or discomfort as you do these exercises. Stop right away if you feel sudden pain or your pain gets worse. Do not begin these exercises until told by your health care provider. Range-of-motion exercises These exercises warm up your muscles and joints and improve the movement and flexibility of your injured elbow and forearm. The exercises also help to relieve pain, numbness, and tingling. These exercises are done using the muscles in your injured elbow and forearm (active). Elbow flexion, active 1. Hold your left / right arm at your side, and bend your elbow (flexion) as far as you can using only your arm muscles. 2. Hold this position for __________ seconds. 3. Slowly return to the starting position. Repeat __________ times. Complete this exercise __________ times a day. Elbow extension, active 1. Hold your left / right arm at your side, and straighten your elbow (extension) as much as you can using only your arm muscles. 2. Hold this position for __________ seconds. 3. Slowly return to the starting position. Repeat __________ times. Complete this exercise __________ times a day. Active forearm rotation, supination This is an exercise in which you turn (rotate) your forearm palm up (supination). 1. Stand or sit with your elbows at your sides. 2. Bend your left / right elbow to a 90-degree angle (right angle). 3. Rotate your palm up until you feel a gentle stretch on the inside of your forearm. 4. Hold this position for __________ seconds. 5. Slowly return to the starting position. Repeat __________ times. Complete this exercise __________ times a day. Active forearm rotation, pronation This is an exercise in which you turn (rotate) your forearm palm down  (pronation). 1. Stand or sit with your elbows at your sides. 2. Bend your left / right elbow to a 90-degree angle (right angle). 3. Rotate your palm down until you feel a gentle stretch on the top of your forearm. 4. Hold this position for __________ seconds. 5. Slowly return to the starting position. Repeat __________ times. Complete this exercise __________ times a day. Stretching exercises These exercises warm up your muscles and joints and improve the movement and flexibility of your injured elbow and forearm. These exercises also help to relieve pain, numbness, and tingling. These exercises are done using your healthy elbow and forearm to help stretch the muscles in your injured elbow and forearm (active-assisted). Elbow flexion, active-assisted  1. Hold your left / right arm at your side, and bend your elbow (flexion) as much as you can using your left / right arm muscles. 2. Use your other hand to bend your left / right elbow farther. To do this, gently push up on your forearm until you feel a gentle stretch on the back of your elbow. 3. Hold this position for __________ seconds. 4. Slowly return to the starting position. Repeat __________ times. Complete this exercise __________ times a day. Elbow extension, active-assisted  1. Hold your left / right arm at your side, and straighten your elbow (extension) as much as you can using your left / right arm muscles. 2. Use your other hand to straighten the left / right elbow farther. To do this, gently push down on your forearm until you feel a gentle stretch on the inside of your elbow. 3. Hold this position for __________  seconds. 4. Slowly return to the starting position. Repeat __________ times. Complete this exercise __________ times a day. Active-assisted forearm rotation, supination This is an exercise in which you turn (rotate) your forearm palm up (supination). 1. Sit with your left / right elbow bent in a 90-degree angle (right  angle) with your forearm resting on a table. 2. Keeping your upper body and shoulder still, rotate your forearm so your palm faces upward. 3. Use your other hand to help rotate your forearm further until you feel a gentle to moderate stretch. 4. Hold this position for __________ seconds. 5. Slowly release the stretch and return to the starting position. Repeat __________ times. Complete this exercise __________ times a day. Active-assisted forearm rotation, pronation This is an exercise in which you turn (rotate) your forearm palm down (pronation). 1. Sit with your left / right elbow bent in a 90-degree angle (right angle) with your forearm resting on a table. 2. Keeping your upper body and shoulder still, rotate your forearm so your palm faces the tabletop. 3. Use your other hand to help rotate your forearm further until you feel a gentle to moderate stretch. 4. Hold this position for __________ seconds. 5. Slowly release the stretch and return to the starting position. Repeat __________ times. Complete this exercise __________ times a day. Passive elbow flexion, supine 1. Lie on your back (supine position). 2. Extend your left / right arm up in the air, bracing it with your other hand. 3. Let your left / right hand slowly lower toward your shoulder (passive flexion), while your elbow stays pointed toward the ceiling. You should feel a gentle stretch along the back of your upper arm and elbow. 4. If instructed by your health care provider, you may increase the intensity of your stretch by adding a small wrist weight or hand weight. 5. Hold this position for __________ seconds. 6. Slowly return to the starting position. Repeat __________ times. Complete this exercise __________ times a day. Passive elbow extension, supine  1. Lie on your back (supine position). Make sure that you are in a comfortable position that lets you relax your arm muscles. 2. Place a folded towel under your left /  right upper arm so your elbow and shoulder are at the same height. Straighten your left / right arm so your elbow does not rest on the bed or towel. 3. Let the weight of your hand stretch your elbow (passive extension). Keep your arm and chest muscles relaxed. You should feel a stretch on the inside of your elbow. 4. If told by your health care provider, you may increase the intensity of your stretch by adding a small wrist weight or hand weight. 5. Hold this position for __________ seconds. 6. Slowly release the stretch. Repeat __________ times. Complete this exercise __________ times a day. Strengthening exercises These exercises build strength and endurance in your elbow and forearm. Endurance is the ability to use your muscles for a long time, even after they get tired. Elbow flexion, isometric  1. Stand or sit up straight. 2. Bend your left / right elbow in a 90-degree angle (right angle), and keep your forearm at the height of your waist. Your thumb should be pointed toward the ceiling (neutral forearm). 3. Place your other hand on top of your left / right forearm. Gently push down while you resist with your left / right arm (isometric flexion). Push as hard as you can with both arms without causing any pain or movement   at your left / right elbow. 4. Hold this position for __________ seconds. 5. Slowly release the tension in both arms. Let your muscles relax completely before you repeat the exercise. Repeat __________ times. Complete this exercise __________ times a day. Elbow extension, isometric  1. Stand or sit up straight. 2. Place your left / right arm so your palm faces your abdomen and is at the height of your waist. 3. Place your other hand on the underside of your left / right forearm. Gently push up while you resist with your left / right arm (isometric extension). Push as hard as you can with both arms without causing any pain or movement at your left / right elbow. 4. Hold this  position for __________ seconds. 5. Slowly release the tension in both arms. Let your muscles relax completely before you repeat the exercise. Repeat __________ times. Complete this exercise __________ times a day. Elbow flexion with forearm palm up  1. Sit on a firm chair without armrests, or stand up. 2. Place your left / right arm at your side with your elbow straight and your palm facing forward. 3. Holding a __________weight or gripping a rubber exercise band or tubing, bend your elbow to bring your hand toward your shoulder (flexion). 4. Hold this position for __________ seconds. 5. Slowly return to the starting position. Repeat __________ times. Complete this exercise __________ times a day. Elbow extension, active  1. Sit on a firm chair without armrests, or stand up. 2. Hold a rubber exercise band or tubing in both hands. 3. Keeping your upper arms at your sides, bring both hands up to your left / right shoulder. Keep your left / right hand just below your other hand. 4. Straighten your left / right elbow (extension) while keeping your other arm still. 5. Hold this position for __________ seconds. 6. Control the resistance of the band or tubing as you return to the starting position. Repeat __________ times. Complete this exercise __________ times a day. Forearm rotation, supination  1. Sit with your left / right forearm supported on a table. Your elbow should be at waist height and bent at a 90-degree angle (right angle). 2. Gently grasp a lightweight hammer. 3. Rest your hand over the edge of the table with your palm facing down. 4. Without moving your left / right elbow, slowly rotate your forearm to turn your palm up toward the ceiling (supination). 5. Hold this position for __________ seconds. 6. Slowly return to the starting position. Repeat __________ times. Complete this exercise __________ times a day. Forearm rotation, pronation  1. Sit with your left / right forearm  supported on a table. Keep your elbow below shoulder height. 2. Gently grasp a lightweight hammer. 3. Rest your hand over the edge of the table with your palm facing up. 4. Without moving your left / right elbow, slowly rotate your forearm to turn your palm down toward the floor (pronation). 5. Hold this position for __________seconds. 6. Slowly return to the starting position. Repeat __________ times. Complete this exercise __________ times a day. This information is not intended to replace advice given to you by your health care provider. Make sure you discuss any questions you have with your health care provider. Document Released: 01/29/2005 Document Revised: 07/08/2018 Document Reviewed: 04/07/2018 [employer] Patient Education  [place]. Hand Exercises Hand exercises can be helpful for almost anyone. These exercises can strengthen the hands, improve flexibility and movement, and increase blood flow to the hands.  These results can make work and daily tasks easier. Hand exercises can be especially helpful for people who have joint pain from arthritis or have nerve damage from overuse (carpal tunnel syndrome). These exercises can also help people who have injured a hand. Exercises Most of these hand exercises are gentle stretching and motion exercises. It is usually safe to do them often throughout the day. Warming up your hands before exercise may help to reduce stiffness. You can do this with gentle massage or by placing your hands in warm water for 10-15 minutes. It is normal to feel some stretching, pulling, tightness, or mild discomfort as you begin new exercises. This will gradually improve. Stop an exercise right away if you feel sudden, severe pain or your pain gets worse. Ask your health care provider which exercises are best for you. Knuckle bend or "claw" fist 1. Stand or sit with your arm, hand, and all five fingers pointed straight up. Make sure to keep your wrist straight  during the exercise. 2. Gently bend your fingers down toward your palm until the tips of your fingers are touching the top of your palm. Keep your big knuckle straight and just bend the small knuckles in your fingers. 3. Hold this position for __________ seconds. 4. Straighten (extend) your fingers back to the starting position. Repeat this exercise 5-10 times with each hand. Full finger fist 1. Stand or sit with your arm, hand, and all five fingers pointed straight up. Make sure to keep your wrist straight during the exercise. 2. Gently bend your fingers into your palm until the tips of your fingers are touching the middle of your palm. 3. Hold this position for __________ seconds. 4. Extend your fingers back to the starting position, stretching every joint fully. Repeat this exercise 5-10 times with each hand. Straight fist 1. Stand or sit with your arm, hand, and all five fingers pointed straight up. Make sure to keep your wrist straight during the exercise. 2. Gently bend your fingers at the big knuckle, where your fingers meet your hand, and the middle knuckle. Keep the knuckle at the tips of your fingers straight and try to touch the bottom of your palm. 3. Hold this position for __________ seconds. 4. Extend your fingers back to the starting position, stretching every joint fully. Repeat this exercise 5-10 times with each hand. Tabletop 1. Stand or sit with your arm, hand, and all five fingers pointed straight up. Make sure to keep your wrist straight during the exercise. 2. Gently bend your fingers at the big knuckle, where your fingers meet your hand, as far down as you can while keeping the small knuckles in your fingers straight. Think of forming a tabletop with your fingers. 3. Hold this position for __________ seconds. 4. Extend your fingers back to the starting position, stretching every joint fully. Repeat this exercise 5-10 times with each hand. Finger spread 1. Place your  hand flat on a table with your palm facing down. Make sure your wrist stays straight as you do this exercise. 2. Spread your fingers and thumb apart from each other as far as you can until you feel a gentle stretch. Hold this position for __________ seconds. 3. Bring your fingers and thumb tight together again. Hold this position for __________ seconds. Repeat this exercise 5-10 times with each hand. Making circles 1. Stand or sit with your arm, hand, and all five fingers pointed straight up. Make sure to keep your wrist straight during the  exercise. 2. Make a circle by touching the tip of your thumb to the tip of your index finger. 3. Hold for __________ seconds. Then open your hand wide. 4. Repeat this motion with your thumb and each finger on your hand. Repeat this exercise 5-10 times with each hand. Thumb motion 1. Sit with your forearm resting on a table and your wrist straight. Your thumb should be facing up toward the ceiling. Keep your fingers relaxed as you move your thumb. 2. Lift your thumb up as high as you can toward the ceiling. Hold for __________ seconds. 3. Bend your thumb across your palm as far as you can, reaching the tip of your thumb for the small finger (pinkie) side of your palm. Hold for __________ seconds. Repeat this exercise 5-10 times with each hand. Grip strengthening  1. Hold a stress ball or other soft ball in the middle of your hand. 2. Slowly increase the pressure, squeezing the ball as much as you can without causing pain. Think of bringing the tips of your fingers into the middle of your palm. All of your finger joints should bend when doing this exercise. 3. Hold your squeeze for __________ seconds, then relax. Repeat this exercise 5-10 times with each hand. Contact a health care provider if:  Your hand pain or discomfort gets much worse when you do an exercise.  Your hand pain or discomfort does not improve within 2 hours after you exercise. If you have  any of these problems, stop doing these exercises right away. Do not do them again unless your health care provider says that you can. Get help right away if:  You develop sudden, severe hand pain or swelling. If this happens, stop doing these exercises right away. Do not do them again unless your health care provider says that you can. This information is not intended to replace advice given to you by your health care provider. Make sure you discuss any questions you have with your health care provider. Document Released: 02/26/2015 Document Revised: 07/08/2018 Document Reviewed: 03/18/2018 Elsevier Patient Education  2020 [employer].

## 2018-12-17 NOTE — Progress Notes (Signed)
Office Visit Note  Patient: Brian Mcclane Bastedo Jr.             Date of Birth: 20-Dec-1955           MRN: [id number]             PCP: Monico Blitz, MD Referring: Monico Blitz, MD Visit Date: 12/28/2018 Occupation: @GUAROCC @  Subjective:  Severe pain in multiple joints.   History of Present Illness: Brian Mccormick. is a 63 y.o. male with history of osteoarthritis and degenerative disc disease.  He continues to have a lot of pain and discomfort in his bilateral hands.  He states his hands are very stiff in the morning and he has difficulty making a fist.  He continues to have numbness in his bilateral first second and third digits.  His right medial epicondylitis is a still painful.  He states this causes nocturnal pain.  He has discomfort in his knee joints only with the stairs.  He continues to have discomfort in the cervical and lumbar spine.  Activities of Daily Living:  Patient reports morning stiffness for several hours.   Patient Reports nocturnal pain.  Difficulty dressing/grooming: Reports Difficulty climbing stairs: Reports Difficulty getting out of chair: Reports Difficulty using hands for taps, buttons, cutlery, and/or writing: Reports  Review of Systems  Constitutional: Negative for fatigue and night sweats.  HENT: Negative for mouth sores, mouth dryness and nose dryness.   Eyes: Negative for redness, itching and dryness.  Respiratory: Negative for shortness of breath, wheezing and difficulty breathing.   Cardiovascular: Negative for chest pain, palpitations, hypertension, irregular heartbeat and swelling in legs/feet.  Gastrointestinal: Negative for abdominal pain, blood in stool, constipation and diarrhea.  Endocrine: Negative for increased urination.  Genitourinary: Negative for difficulty urinating and painful urination.  Musculoskeletal: Positive for arthralgias, joint pain, joint swelling and morning stiffness. Negative for myalgias, muscle weakness, muscle tenderness  and myalgias.  Skin: Negative for color change, rash, hair loss, nodules/bumps, skin tightness, ulcers and sensitivity to sunlight.  Allergic/Immunologic: Negative for susceptible to infections.  Neurological: Negative for dizziness, fainting, headaches, memory loss, night sweats and weakness.  Hematological: Negative for bruising/bleeding tendency and swollen glands.  Psychiatric/Behavioral: Positive for sleep disturbance. Negative for depressed mood and confusion. The patient is not nervous/anxious.     PMFS History:  Patient Active Problem List   Diagnosis Date Noted  . DDD (degenerative disc disease), cervical 12/13/2018  . DDD (degenerative disc disease), lumbar 12/13/2018  . Essential hypertension 12/13/2018  . History of aortic aneurysm 12/13/2018  . History of gastroesophageal reflux (GERD) 12/13/2018  . Primary osteoarthritis of both hands 12/13/2018  . Unstable angina ([place]) 10/03/2015  . Abnormal nuclear stress test 10/03/2015  . Chest pain 10/03/2015    Past Medical History:  Diagnosis Date  . Abnormal nuclear stress test 09/25/15  . Aortic aneurysm ([place]) dx'd ~ 2013   a. 4.1 cm ascending aortic aneurysm by CT in 09/2015  . Arthritis    "hands" (10/03/2015)  . CAD (coronary artery disease)    a. cath 09/2015: 50% stenosis Ost 2nd Diag, 20% prox Cx, 20% prox RCA  . Chronic radicular pain of lower back    "goes into both legs" (10/03/2015)  . GERD (gastroesophageal reflux disease)    "only when I was taking Morphine" (10/03/2015)  . Pneumonia 1994  . Racing heart beat ~ 2013    Family History  Problem Relation Age of Onset  . Heart attack Father   .  Arthritis Father   . Arthritis Paternal Grandmother   . Healthy Son    Past Surgical History:  Procedure Laterality Date  . BACK SURGERY    . CARDIAC CATHETERIZATION  10/03/2015  . CARDIAC CATHETERIZATION N/A 10/03/2015   Procedure: Left Heart Cath and Coronary Angiography;  Surgeon: Peter M Martinique, MD;  Location: [facility];  Service: Cardiovascular;  Laterality: N/A;  . CERVICAL LAMINECTOMY  1996   C5  . HERNIA REPAIR    . LUMBAR LAMINECTOMY  1982  . PAIN PUMP IMPLANTATION  ~ 2010   "Morphine"  . PAIN PUMP REMOVAL  ~ 2015  . POSTERIOR LUMBAR FUSION  1998   L4-5, S1  . ROTATOR CUFF REPAIR Left 02/2018  . SHOULDER ARTHROSCOPY WITH ROTATOR CUFF REPAIR AND OPEN BICEPS TENODESIS Right 03/2015   "massive biceps tear"  . SPINAL CORD STIMULATOR IMPLANT  2003; ~ 2015   "TENS"  . UMBILICAL HERNIA REPAIR  ~ 66   Social History   Social History Narrative  . Not on file    There is no immunization history on file for this patient.   Objective: Vital Signs: BP 112/72 (BP Location: Right Arm, Patient Position: Sitting, Cuff Size: Normal)   Pulse 79   Resp 15   Ht 5' 11"  (1.803 m)   Wt 203 lb 3.2 oz (92.2 kg)   BMI 28.34 kg/m    Physical Exam Vitals signs and nursing note reviewed.  Constitutional:      Appearance: He is well-developed.  HENT:     Head: Normocephalic and atraumatic.  Eyes:     Conjunctiva/sclera: Conjunctivae normal.     Pupils: Pupils are equal, round, and reactive to light.  Neck:     Musculoskeletal: Normal range of motion and neck supple.  Cardiovascular:     Rate and Rhythm: Normal rate and regular rhythm.     Heart sounds: Normal heart sounds.  Pulmonary:     Effort: Pulmonary effort is normal.     Breath sounds: Normal breath sounds.  Abdominal:     General: Bowel sounds are normal.     Palpations: Abdomen is soft.  Skin:    General: Skin is warm and dry.     Capillary Refill: Capillary refill takes less than 2 seconds.  Neurological:     Mental Status: He is alert and oriented to person, place, and time.  Psychiatric:        Behavior: Behavior normal.      Musculoskeletal Exam: He has limited painful range of motion of his cervical and lumbar spine.  He has painful range of motion of his bilateral shoulders.  Elbow joints with good range of  motion.  He had tenderness on palpation over right medial epicondyle.  He has good range of motion of bilateral wrist joints with discomfort.  He has incomplete fist formation.  He had bilateral second and third MCP thickening and PIP thickening.  No synovitis was noted.  Hip joints and knee joints were in good range of motion.  No warmth swelling or effusion was noted.  CDAI Exam: CDAI Score: - Patient Global: -; Provider Global: - Swollen: -; Tender: - Joint Exam   No joint exam has been documented for this visit   There is currently no information documented on the homunculus. Go to the Rheumatology activity and complete the homunculus joint exam.  Investigation: No additional findings.  Imaging: Xr Hand 2 View Left  Result Date: 12/13/2018 PIP DIP and  CMC joint narrowing was noted.  Severe second and third MCP joint with hanging osteophyte and possible chondrocalcinosis at the third MCP joint was noted. Impression: These findings are consistent with osteoarthritis and inflammatory arthritis most likely chondrocalcinosis.  Xr Hand 2 View Right  Result Date: 12/13/2018 Severe CMC narrowing and subluxation was noted.  PIP and DIP narrowing was noted.  Severe narrowing of second and third MCP joint with subluxation of third MCP joint and hanging osteophyte was noted.  Some calcification around the joint was noted. Impression: These findings are consistent with osteoarthritis and inflammatory arthritis most likely due to chondrocalcinosis.  Xr Knee 3 View Left  Result Date: 12/13/2018 Mild to moderate medial compartment narrowing was noted.  Chondrocalcinosis was noted.  Mild patellofemoral narrowing was noted. Impression: These findings are consistent with moderate osteoarthritis, mild chondromalacia patella and chondrocalcinosis.   Recent Labs: Lab Results  Component Value Date   WBC 6.7 12/13/2018   HGB 15.2 12/13/2018   PLT 260 12/13/2018   NA 139 12/13/2018   K 4.8 12/13/2018    CL 104 12/13/2018   CO2 29 12/13/2018   GLUCOSE 95 12/13/2018   BUN 17 12/13/2018   CREATININE 1.05 12/13/2018   BILITOT 0.9 12/13/2018   AST 13 12/13/2018   ALT 10 12/13/2018   PROT 6.7 12/13/2018   CALCIUM 9.6 12/13/2018   GFRAA 87 12/13/2018  December 13, 2018 magnesium normal, iron studies normal, uric acid 4.7, anti-CCP negative  November 18, 2018 RNP negative, Sm negative, RF negative, ESR 6, CRP 1.2 elevated  Speciality Comments: No specialty comments available.  Procedures:  No procedures performed Allergies: Patient has no known allergies.   Assessment / Plan:     Visit Diagnoses: Primary osteoarthritis of both hands - Severe osteoarthritis of bilateral hands.  X-rays also revealed bilateral second and third MCP narrowing.  All autoimmune work-up has been negative.  I believe most of the symptoms are related to chondrocalcinosis.  Although seronegative rheumatoid arthritis has to be in the differential.  I discussed the option of trying colchicine.  He is on Crestor.  Low-dose colchicine will be safe to try.  I will place him on colchicine 0.6 mg p.o. daily.  He had GI side effects from NSAIDs.  I have also given him a list of natural anti-inflammatories.  I offered ultrasound evaluation of bilateral hands to look for synovitis which she declined at this time.  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  Magnesium and iron studies were normal.  A trial of colchicine will be helpful.  Primary osteoarthritis of left knee - Moderate osteoarthritis and mild chondromalacia patella with chondrocalcinosis.  Medial epicondylitis of right elbow-he continues to have some discomfort.  He states he has tried cortisone injection in the past.  Chronic left shoulder pain - History of rotator cuff tear repair and biceps tendon repair of bilateral shoulders per patient.  He is chronic discomfort in both shoulders.  DDD (degenerative disc disease), cervical - s/p  laminectomy  DDD (degenerative disc disease), lumbar - Status post fusion.  He has a stim unit and is on pain management.  History of right-sided radiculopathy.  Opiate use - Norco 10/325 mg every 4 hours  Essential hypertension  History of aortic aneurysm  History of gastroesophageal reflux (GERD)  Orders: No orders of the defined types were placed in this encounter.  No orders of the defined types were placed in this encounter.   Face-to-face time spent with patient was 30 minutes.  Greater than 50% of time was spent in counseling and coordination of care.  Follow-Up Instructions: Return in about 4 weeks (around 01/25/2019).   Bo Merino, MD  Note - This record has been created using Editor, commissioning.  Chart creation errors have been sought, but may not always  have been located. Such creation errors do not reflect on  the standard of medical care.

## 2018-12-18 LAB — CBC WITH DIFFERENTIAL/PLATELET
Absolute Monocytes: 529 cells/uL (ref 200–950)
Basophils Absolute: 47 cells/uL (ref 0–200)
Basophils Relative: 0.7 %
Eosinophils Absolute: 476 cells/uL (ref 15–500)
Eosinophils Relative: 7.1 %
HCT: 44.2 % (ref 38.5–50.0)
Hemoglobin: 15.2 g/dL (ref 13.2–17.1)
Lymphs Abs: 1387 cells/uL (ref 850–3900)
MCH: 31.5 pg (ref 27.0–33.0)
MCHC: 34.4 g/dL (ref 32.0–36.0)
MCV: 91.7 fL (ref 80.0–100.0)
MPV: 9.7 fL (ref 7.5–12.5)
Monocytes Relative: 7.9 %
Neutro Abs: 4261 cells/uL (ref 1500–7800)
Neutrophils Relative %: 63.6 %
Platelets: 260 10*3/uL (ref 140–400)
RBC: 4.82 10*6/uL (ref 4.20–5.80)
RDW: 12.7 % (ref 11.0–15.0)
Total Lymphocyte: 20.7 %
WBC: 6.7 10*3/uL (ref 3.8–10.8)

## 2018-12-18 LAB — COMPLETE METABOLIC PANEL WITH GFR
AG Ratio: 2.2 (calc) (ref 1.0–2.5)
ALT: 10 U/L (ref 9–46)
AST: 13 U/L (ref 10–35)
Albumin: 4.6 g/dL (ref 3.6–5.1)
Alkaline phosphatase (APISO): 50 U/L (ref 35–144)
BUN: 17 mg/dL (ref 7–25)
CO2: 29 mmol/L (ref 20–32)
Calcium: 9.6 mg/dL (ref 8.6–10.3)
Chloride: 104 mmol/L (ref 98–110)
Creat: 1.05 mg/dL (ref 0.70–1.25)
GFR, Est African American: 87 mL/min/{1.73_m2} (ref >=60)
GFR, Est Non African American: 75 mL/min/{1.73_m2} (ref >=60)
Globulin: 2.1 g/dL (calc) (ref 1.9–3.7)
Glucose, Bld: 95 mg/dL (ref 65–99)
Potassium: 4.8 mmol/L (ref 3.5–5.3)
Sodium: 139 mmol/L (ref 135–146)
Total Bilirubin: 0.9 mg/dL (ref 0.2–1.2)
Total Protein: 6.7 g/dL (ref 6.1–8.1)

## 2018-12-18 LAB — URIC ACID: Uric Acid, Serum: 4.7 mg/dL (ref 4.0–8.0)

## 2018-12-18 LAB — IRON,TIBC AND FERRITIN PANEL
%SAT: 30 % (calc) (ref 20–48)
Ferritin: 202 ng/mL (ref 24–380)
Iron: 96 ug/dL (ref 50–180)
TIBC: 325 mcg/dL (calc) (ref 250–425)

## 2018-12-18 LAB — MAGNESIUM: Magnesium: 2.1 mg/dL (ref 1.5–2.5)

## 2018-12-18 LAB — 14-3-3 ETA PROTEIN: 14-3-3 eta Protein: <0.2 ng/mL (ref <0.2)

## 2018-12-18 LAB — CYCLIC CITRUL PEPTIDE ANTIBODY, IGG: Cyclic Citrullin Peptide Ab: <16 UNITS

## 2018-12-28 ENCOUNTER — Other Ambulatory Visit: Payer: Self-pay

## 2018-12-28 ENCOUNTER — Encounter: Payer: Self-pay | Admitting: Rheumatology

## 2018-12-28 ENCOUNTER — Ambulatory Visit: Payer: Medicare HMO | Admitting: Rheumatology

## 2018-12-28 VITALS — BP 112/72 | HR 79 | Resp 15 | Ht 71.0 in | Wt 203.2 lb

## 2018-12-28 DIAGNOSIS — M5136 Other intervertebral disc degeneration, lumbar region: Secondary | ICD-10-CM

## 2018-12-28 DIAGNOSIS — M7701 Medial epicondylitis, right elbow: Secondary | ICD-10-CM

## 2018-12-28 DIAGNOSIS — G8929 Other chronic pain: Secondary | ICD-10-CM

## 2018-12-28 DIAGNOSIS — F1190 Opioid use, unspecified, uncomplicated: Secondary | ICD-10-CM

## 2018-12-28 DIAGNOSIS — M1712 Unilateral primary osteoarthritis, left knee: Secondary | ICD-10-CM | POA: Diagnosis not present

## 2018-12-28 DIAGNOSIS — M5030 Other cervical disc degeneration, unspecified cervical region: Secondary | ICD-10-CM

## 2018-12-28 DIAGNOSIS — M1120 Other chondrocalcinosis, unspecified site: Secondary | ICD-10-CM | POA: Diagnosis not present

## 2018-12-28 DIAGNOSIS — M19042 Primary osteoarthritis, left hand: Secondary | ICD-10-CM

## 2018-12-28 DIAGNOSIS — M19041 Primary osteoarthritis, right hand: Secondary | ICD-10-CM | POA: Diagnosis not present

## 2018-12-28 DIAGNOSIS — Z8679 Personal history of other diseases of the circulatory system: Secondary | ICD-10-CM

## 2018-12-28 DIAGNOSIS — I10 Essential (primary) hypertension: Secondary | ICD-10-CM

## 2018-12-28 DIAGNOSIS — M25512 Pain in left shoulder: Secondary | ICD-10-CM

## 2018-12-28 DIAGNOSIS — Z8719 Personal history of other diseases of the digestive system: Secondary | ICD-10-CM

## 2018-12-28 MED ORDER — COLCHICINE 0.6 MG PO TABS: 0.6000 mg | ORAL_TABLET | Freq: Every day | ORAL | 1 refills | Status: DC

## 2018-12-28 NOTE — Patient Instructions (Signed)
   Start colchicine 0.6 mg daily  Start Coenzyme Q10 100 mg to 200 mg daily  Monitor for signs/symptoms of rhabdomyolysis such as dark color urine and severe muscle tenderness/weakness

## 2018-12-28 NOTE — Progress Notes (Signed)
Pharmacy Note  Subjective:  Patient presents today to the [facility] to see Dr. Estanislado Pandy.   Patient seen by the pharmacist for counseling on colcrys and natural anti-inflammatories.  Objective: Current Outpatient Medications on File Prior to Visit  Medication Sig Dispense Refill  . HYDROcodone-acetaminophen (NORCO) 10-325 MG tablet Take 1 tablet by mouth every 4 (four) hours.  0  . lisinopril (PRINIVIL,ZESTRIL) 5 MG tablet TAKE 1 TABLET(5 MG) BY MOUTH DAILY 90 tablet 1  . omeprazole (PRILOSEC) 20 MG capsule Take 20 mg by mouth daily as needed (for indigestion/heartburn).   2  . polyethylene glycol powder (GLYCOLAX/MIRALAX) powder Mix 17 grams with 6-8 ounces of water or juice and drink once every morning  2  . rosuvastatin (CRESTOR) 5 MG tablet Take 1 tablet (5 mg total) by mouth daily. 30 tablet 6  . traZODone (DESYREL) 100 MG tablet Take 100 mg by mouth at bedtime.  2   No current facility-administered medications on file prior to visit.      Assessment/Plan:  Counseled patient on the purpose proper use, and adverse effects of colchicine including diarrhea.  Provided patient with medication education material and answered all questions.    He is currently on Crestor 5 mg.  When taken with cholchicine can increase risk of rhabdomyolysis.  The plan is to start him on low dose colchicine 0.6 mg along with CoQ10 100 mg to 200 mg daily to minimize risk of rhabdomyolysis.  Counseled patient to monitor for signs/ symptoms of rhabdomyolysis including dark-colored urine and/or muscle pain, tenderness, or weakness.  Counseled on the purpose, proper use, and adverse effects of natural anti-inflammatories including upset stomach and increased bleeding risk.  Encouraged patient to add one medication at a time and to include on medication list to monitor for adverse effects and drug interactions.  Given educational handout with recommended doses.  All questions encouraged and answered.   Instructed patient to call with any other questions or concerns.   Mariella Saa, PharmD, [place], [place] Clinical Specialty Pharmacist [phone]  12/28/2018 3:09 PM

## 2019-01-11 NOTE — Progress Notes (Deleted)
Office Visit Note  Patient: Brian Crossley Mcphee Jr.             Date of Birth: [birth year]           MRN: [id number]             PCP: Monico Blitz, MD Referring: Monico Blitz, MD Visit Date: 01/25/2019 Occupation: @GUAROCC @  Subjective:  No chief complaint on file.   History of Present Illness: Brian Allender Griebel Brooke Bonito. is a 63 y.o. male ***   Activities of Daily Living:  Patient reports morning stiffness for *** {minute/hour:19697}.   Patient {ACTIONS;DENIES/REPORTS:21021675::"Denies"} nocturnal pain.  Difficulty dressing/grooming: {ACTIONS;DENIES/REPORTS:21021675::"Denies"} Difficulty climbing stairs: {ACTIONS;DENIES/REPORTS:21021675::"Denies"} Difficulty getting out of chair: {ACTIONS;DENIES/REPORTS:21021675::"Denies"} Difficulty using hands for taps, buttons, cutlery, and/or writing: {ACTIONS;DENIES/REPORTS:21021675::"Denies"}  No Rheumatology ROS completed.   PMFS History:  Patient Active Problem List   Diagnosis Date Noted  . DDD (degenerative disc disease), cervical 12/13/2018  . DDD (degenerative disc disease), lumbar 12/13/2018  . Essential hypertension 12/13/2018  . History of aortic aneurysm 12/13/2018  . History of gastroesophageal reflux (GERD) 12/13/2018  . Primary osteoarthritis of both hands 12/13/2018  . Unstable angina ([place]) 10/03/2015  . Abnormal nuclear stress test 10/03/2015  . Chest pain 10/03/2015    Past Medical History:  Diagnosis Date  . Abnormal nuclear stress test 09/25/15  . Aortic aneurysm ([place]) dx'd ~ 2013   a. 4.1 cm ascending aortic aneurysm by CT in 09/2015  . Arthritis    "hands" (10/03/2015)  . CAD (coronary artery disease)    a. cath 09/2015: 50% stenosis Ost 2nd Diag, 20% prox Cx, 20% prox RCA  . Chronic radicular pain of lower back    "goes into both legs" (10/03/2015)  . GERD (gastroesophageal reflux disease)    "only when I was taking Morphine" (10/03/2015)  . Pneumonia 1994  . Racing heart beat ~ 2013    Family History  Problem Relation  Age of Onset  . Heart attack Father   . Arthritis Father   . Arthritis Paternal Grandmother   . Healthy Son    Past Surgical History:  Procedure Laterality Date  . BACK SURGERY    . CARDIAC CATHETERIZATION  10/03/2015  . CARDIAC CATHETERIZATION N/A 10/03/2015   Procedure: Left Heart Cath and Coronary Angiography;  Surgeon: Peter M Martinique, MD;  Location: [facility];  Service: Cardiovascular;  Laterality: N/A;  . CERVICAL LAMINECTOMY  1996   C5  . HERNIA REPAIR    . LUMBAR LAMINECTOMY  1982  . PAIN PUMP IMPLANTATION  ~ 2010   "Morphine"  . PAIN PUMP REMOVAL  ~ 2015  . POSTERIOR LUMBAR FUSION  1998   L4-5, S1  . ROTATOR CUFF REPAIR Left 02/2018  . SHOULDER ARTHROSCOPY WITH ROTATOR CUFF REPAIR AND OPEN BICEPS TENODESIS Right 03/2015   "massive biceps tear"  . SPINAL CORD STIMULATOR IMPLANT  2003; ~ 2015   "TENS"  . UMBILICAL HERNIA REPAIR  ~ 5   Social History   Social History Narrative  . Not on file    There is no immunization history on file for this patient.   Objective: Vital Signs: There were no vitals taken for this visit.   Physical Exam   Musculoskeletal Exam: ***  CDAI Exam: CDAI Score: - Patient Global: -; Provider Global: - Swollen: -; Tender: - Joint Exam   No joint exam has been documented for this visit   There is currently no information documented on the homunculus. Go to the  Rheumatology activity and complete the homunculus joint exam.  Investigation: No additional findings.  Imaging: Xr Hand 2 View Left  Result Date: 12/13/2018 PIP DIP and CMC joint narrowing was noted.  Severe second and third MCP joint with hanging osteophyte and possible chondrocalcinosis at the third MCP joint was noted. Impression: These findings are consistent with osteoarthritis and inflammatory arthritis most likely chondrocalcinosis.  Xr Hand 2 View Right  Result Date: 12/13/2018 Severe CMC narrowing and subluxation was noted.  PIP and DIP narrowing was  noted.  Severe narrowing of second and third MCP joint with subluxation of third MCP joint and hanging osteophyte was noted.  Some calcification around the joint was noted. Impression: These findings are consistent with osteoarthritis and inflammatory arthritis most likely due to chondrocalcinosis.  Xr Knee 3 View Left  Result Date: 12/13/2018 Mild to moderate medial compartment narrowing was noted.  Chondrocalcinosis was noted.  Mild patellofemoral narrowing was noted. Impression: These findings are consistent with moderate osteoarthritis, mild chondromalacia patella and chondrocalcinosis.   Recent Labs: Lab Results  Component Value Date   WBC 6.7 12/13/2018   HGB 15.2 12/13/2018   PLT 260 12/13/2018   NA 139 12/13/2018   K 4.8 12/13/2018   CL 104 12/13/2018   CO2 29 12/13/2018   GLUCOSE 95 12/13/2018   BUN 17 12/13/2018   CREATININE 1.05 12/13/2018   BILITOT 0.9 12/13/2018   AST 13 12/13/2018   ALT 10 12/13/2018   PROT 6.7 12/13/2018   CALCIUM 9.6 12/13/2018   GFRAA 87 12/13/2018    Speciality Comments: No specialty comments available.  Procedures:  No procedures performed Allergies: Patient has no known allergies.   Assessment / Plan:     Visit Diagnoses: No diagnosis found.  Orders: No orders of the defined types were placed in this encounter.  No orders of the defined types were placed in this encounter.   Face-to-face time spent with patient was *** minutes. Greater than 50% of time was spent in counseling and coordination of care.  Follow-Up Instructions: No follow-ups on file.   Ellen Henri, CMA  Note - This record has been created using Animal nutritionist.  Chart creation errors have been sought, but may not always  have been located. Such creation errors do not reflect on  the standard of medical care.

## 2019-01-25 ENCOUNTER — Ambulatory Visit: Payer: Medicare HMO | Admitting: Rheumatology

## 2019-01-25 NOTE — Progress Notes (Deleted)
Office Visit Note  Patient: Brian Avakian Krager Jr.             Date of Birth: [birth year]           MRN: [id number]             PCP: Monico Blitz, MD Referring: Monico Blitz, MD Visit Date: 02/04/2019 Occupation: @GUAROCC @  Subjective:  No chief complaint on file.   History of Present Illness: Brian Wilhoite Caiazzo Brooke Bonito. is a 63 y.o. male ***   Activities of Daily Living:  Patient reports morning stiffness for *** {minute/hour:19697}.   Patient {ACTIONS;DENIES/REPORTS:21021675::"Denies"} nocturnal pain.  Difficulty dressing/grooming: {ACTIONS;DENIES/REPORTS:21021675::"Denies"} Difficulty climbing stairs: {ACTIONS;DENIES/REPORTS:21021675::"Denies"} Difficulty getting out of chair: {ACTIONS;DENIES/REPORTS:21021675::"Denies"} Difficulty using hands for taps, buttons, cutlery, and/or writing: {ACTIONS;DENIES/REPORTS:21021675::"Denies"}  No Rheumatology ROS completed.   PMFS History:  Patient Active Problem List   Diagnosis Date Noted  . DDD (degenerative disc disease), cervical 12/13/2018  . DDD (degenerative disc disease), lumbar 12/13/2018  . Essential hypertension 12/13/2018  . History of aortic aneurysm 12/13/2018  . History of gastroesophageal reflux (GERD) 12/13/2018  . Primary osteoarthritis of both hands 12/13/2018  . Unstable angina ([place]) 10/03/2015  . Abnormal nuclear stress test 10/03/2015  . Chest pain 10/03/2015    Past Medical History:  Diagnosis Date  . Abnormal nuclear stress test 09/25/15  . Aortic aneurysm ([place]) dx'd ~ 2013   a. 4.1 cm ascending aortic aneurysm by CT in 09/2015  . Arthritis    "hands" (10/03/2015)  . CAD (coronary artery disease)    a. cath 09/2015: 50% stenosis Ost 2nd Diag, 20% prox Cx, 20% prox RCA  . Chronic radicular pain of lower back    "goes into both legs" (10/03/2015)  . GERD (gastroesophageal reflux disease)    "only when I was taking Morphine" (10/03/2015)  . Pneumonia 1994  . Racing heart beat ~ 2013    Family History  Problem Relation  Age of Onset  . Heart attack Father   . Arthritis Father   . Arthritis Paternal Grandmother   . Healthy Son    Past Surgical History:  Procedure Laterality Date  . BACK SURGERY    . CARDIAC CATHETERIZATION  10/03/2015  . CARDIAC CATHETERIZATION N/A 10/03/2015   Procedure: Left Heart Cath and Coronary Angiography;  Surgeon: Peter M Martinique, MD;  Location: [facility];  Service: Cardiovascular;  Laterality: N/A;  . CERVICAL LAMINECTOMY  1996   C5  . HERNIA REPAIR    . LUMBAR LAMINECTOMY  1982  . PAIN PUMP IMPLANTATION  ~ 2010   "Morphine"  . PAIN PUMP REMOVAL  ~ 2015  . POSTERIOR LUMBAR FUSION  1998   L4-5, S1  . ROTATOR CUFF REPAIR Left 02/2018  . SHOULDER ARTHROSCOPY WITH ROTATOR CUFF REPAIR AND OPEN BICEPS TENODESIS Right 03/2015   "massive biceps tear"  . SPINAL CORD STIMULATOR IMPLANT  2003; ~ 2015   "TENS"  . UMBILICAL HERNIA REPAIR  ~ 64   Social History   Social History Narrative  . Not on file    There is no immunization history on file for this patient.   Objective: Vital Signs: There were no vitals taken for this visit.   Physical Exam   Musculoskeletal Exam: ***  CDAI Exam: CDAI Score: - Patient Global: -; Provider Global: - Swollen: -; Tender: - Joint Exam   No joint exam has been documented for this visit   There is currently no information documented on the homunculus. Go to the  Rheumatology activity and complete the homunculus joint exam.  Investigation: No additional findings.  Imaging: No results found.  Recent Labs: Lab Results  Component Value Date   WBC 6.7 12/13/2018   HGB 15.2 12/13/2018   PLT 260 12/13/2018   NA 139 12/13/2018   K 4.8 12/13/2018   CL 104 12/13/2018   CO2 29 12/13/2018   GLUCOSE 95 12/13/2018   BUN 17 12/13/2018   CREATININE 1.05 12/13/2018   BILITOT 0.9 12/13/2018   AST 13 12/13/2018   ALT 10 12/13/2018   PROT 6.7 12/13/2018   CALCIUM 9.6 12/13/2018   GFRAA 87 12/13/2018    Speciality Comments:  No specialty comments available.  Procedures:  No procedures performed Allergies: Patient has no known allergies.   Assessment / Plan:     Visit Diagnoses: No diagnosis found.  Orders: No orders of the defined types were placed in this encounter.  No orders of the defined types were placed in this encounter.   Face-to-face time spent with patient was *** minutes. Greater than 50% of time was spent in counseling and coordination of care.  Follow-Up Instructions: No follow-ups on file.   Gearldine Bienenstock, PA-C  Note - This record has been created using Dragon software.  Chart creation errors have been sought, but may not always  have been located. Such creation errors do not reflect on  the standard of medical care.

## 2019-02-03 ENCOUNTER — Telehealth: Payer: Self-pay | Admitting: Rheumatology

## 2019-02-03 DIAGNOSIS — M1120 Other chondrocalcinosis, unspecified site: Secondary | ICD-10-CM

## 2019-02-03 NOTE — Telephone Encounter (Signed)
Patient has not started Colchicine as of yet. Pharmacy had 90 day supply, but patient wanted to start with 30 day supply to start. Pharmacy was to call for a new rx, and patient has not heard anything since then. Patient canceled follow up appt for 02/04/2019 due to not starting medication. Please call to advise.

## 2019-02-04 ENCOUNTER — Ambulatory Visit: Payer: Medicare HMO | Admitting: Rheumatology

## 2019-02-04 MED ORDER — COLCHICINE 0.6 MG PO TABS: 0.6000 mg | ORAL_TABLET | Freq: Every day | ORAL | 0 refills | Status: DC

## 2019-02-04 NOTE — Telephone Encounter (Signed)
Patient would like to start with a 30-day supply due to cost of medication. Patient has medicare and is unable to use co-pay cards. 30 day supply of colchicine has been sent to patient's pharmacy.

## 2019-07-21 ENCOUNTER — Other Ambulatory Visit: Payer: Self-pay | Admitting: Cardiovascular Disease

## 2019-09-02 ENCOUNTER — Other Ambulatory Visit: Payer: Self-pay | Admitting: Cardiovascular Disease

## 2019-10-12 ENCOUNTER — Ambulatory Visit: Payer: Medicare HMO | Admitting: Cardiovascular Disease

## 2019-10-16 NOTE — Progress Notes (Signed)
Cardiology Office Note  Date: 10/18/2019   ID: Brian Beat Tith Jr., DOB 1955-11-20, MRN [id number]  PCP:  Kirstie Peri, MD  Cardiologist:  No primary care provider on file. Electrophysiologist:  None   Chief Complaint: Follow-up CAD, thoracic aortic aneurysm, HTN, cardiomyopathy, chronic systolic heart failure, bradycardia.  History of Present Illness: Brian Samford Laviolette Montez Hageman. is a 64 y.o. male with a history of CAD, thoracic aortic aneurysm, HTN, cardiomyopathy, chronic systolic heart failure, bradycardia.  Thoracic aneurysm by CT 02/10/2017 : 4.1 cm. Cardiac catheterization 10/03/2015: Nonobstructive CAD.  Proximal RCA 20%, proximal circumflex 20%, ostial second diagonal 50%.  Mild ascending aortic aneurysm 4.1 cm. Echocardiogram : mild to moderately reduced LV systolic function EF 40 to 45%.  Last encounter Dr. Purvis Sheffield via telemedicine 08/05/2018.  His CAD was symptomatically stable.  He did not tolerate beta-blockers due to bradycardia.  Cardiomyopathy/chronic systolic heart failure was symptomatically stable.  He was continuing  lisinopril 5 mg daily.  Follow-up echocardiogram was ordered.  Had bradycardia with a heart rate of 51.  Dr. Purvis Sheffield was attempting to obtain labs from PCP including TSH.  He asked patient to monitor his pulse routinely Brian to notify him if heart rate decreased into the 30 to 40 bpm/min range.  He denied any syncope.  Patient is here for one -year follow-up.  He denies any recent acute illnesses or hospitalizations.  He is having some back pain from history of back issues Brian past surgeries i.e. lumbar spinal fusion.  Denies any progressive anginal or exertional symptoms, palpitations or arrhythmias, orthostatic symptoms, stroke or TIA-like symptoms, PND or orthopnea, bleeding issues, claudication-like symptoms, DVT or PE-like symptoms, or lower extremity edema.  Has a history of a mild ascending aortic aneurysm last measured at 4.1 cm.  Last study was done in  2019.  Past Medical History:  Diagnosis Date  . Abnormal nuclear stress test 09/25/15  . Aortic aneurysm (HCC) dx'd ~ 2013   a. 4.1 cm ascending aortic aneurysm by CT in 09/2015  . Arthritis    "hands" (10/03/2015)  . CAD (coronary artery disease)    a. cath 09/2015: 50% stenosis Ost 2nd Diag, 20% prox Cx, 20% prox RCA  . Chronic radicular pain of lower back    "goes into both legs" (10/03/2015)  . GERD (gastroesophageal reflux disease)    "only when I was taking Morphine" (10/03/2015)  . Pneumonia 1994  . Racing heart beat ~ 2013    Past Surgical History:  Procedure Laterality Date  . BACK SURGERY    . CARDIAC CATHETERIZATION  10/03/2015  . CARDIAC CATHETERIZATION N/A 10/03/2015   Procedure: Left Heart Cath Brian Coronary Angiography;  Surgeon: Peter M Swaziland, MD;  Location: [facility] INVASIVE CV LAB;  Service: Cardiovascular;  Laterality: N/A;  . CERVICAL LAMINECTOMY  1996   C5  . HERNIA REPAIR    . LUMBAR LAMINECTOMY  1982  . PAIN PUMP IMPLANTATION  ~ 2010   "Morphine"  . PAIN PUMP REMOVAL  ~ 2015  . POSTERIOR LUMBAR FUSION  1998   L4-5, S1  . ROTATOR CUFF REPAIR Left 02/2018  . SHOULDER ARTHROSCOPY WITH ROTATOR CUFF REPAIR Brian OPEN BICEPS TENODESIS Right 03/2015   "massive biceps tear"  . SPINAL CORD STIMULATOR IMPLANT  2003; ~ 2015   "TENS"  . UMBILICAL HERNIA REPAIR  ~ 1978    Current Outpatient Medications  Medication Sig Dispense Refill  . colchicine 0.6 MG tablet Take 1 tablet (0.6 mg total) by mouth daily.  30 tablet 0  . HYDROcodone-acetaminophen (NORCO) 10-325 MG tablet Take 1 tablet by mouth every 4 (four) hours.  0  . lisinopril (PRINIVIL,ZESTRIL) 5 MG tablet TAKE 1 TABLET(5 MG) BY MOUTH DAILY 90 tablet 1  . omeprazole (PRILOSEC) 20 MG capsule Take 20 mg by mouth daily as needed (for indigestion/heartburn).   2  . polyethylene glycol powder (GLYCOLAX/MIRALAX) powder Mix 17 grams with 6-8 ounces of water or juice Brian drink once every morning  2   No current  facility-administered medications for this visit.   Allergies:  Patient has no known allergies.   Social History: The patient  reports that he quit smoking about 15 years ago. His smoking use included cigarettes. He started smoking about 50 years ago. He has a 35.00 pack-year smoking history. He has never used smokeless tobacco. He reports previous alcohol use. He reports that he does not use drugs.   Family History: The patient's family history includes Arthritis in his father Brian paternal grandmother; Healthy in his son; Heart attack in his father.   ROS:  Please see the history of present illness. Otherwise, complete review of systems is positive for none.  All other systems are reviewed Brian negative.   Physical Exam: VS:  BP 118/78   Pulse (!) 54   Ht 5\' 11"  (1.803 m)   Wt 205 lb (93 kg)   SpO2 98%   BMI 28.59 kg/m , BMI Body mass index is 28.59 kg/m.  Wt Readings from Last 3 Encounters:  10/18/19 205 lb (93 kg)  12/28/18 203 lb 3.2 oz (92.2 kg)  12/13/18 207 lb 9.6 oz (94.2 kg)    General: Patient appears comfortable at rest. Neck: Supple, no elevated JVP or carotid bruits, no thyromegaly. Lungs: Clear to auscultation, nonlabored breathing at rest. Cardiac: Regular rate Brian rhythm, no S3 or significant systolic murmur, no pericardial rub. Extremities: No pitting edema, distal pulses 2+. Skin: Warm Brian dry. Musculoskeletal: No kyphosis. Neuropsychiatric: Alert Brian oriented x3, affect grossly appropriate.  ECG:  An ECG dated 10/18/2019 was personally reviewed today Brian demonstrated:  Sinus bradycardia left axis deviation, RSR prime or QR pattern in V1 suggests RV conduction delay.  Rate of 53  Recent Labwork: 12/13/2018: ALT 10; AST 13; BUN 17; Creat 1.05; Hemoglobin 15.2; Magnesium 2.1; Platelets 260; Potassium 4.8; Sodium 139     Component Value Date/Time   CHOL 204 (H) 10/04/2015 0300   TRIG 155 (H) 10/04/2015 0300   HDL 49 10/04/2015 0300   CHOLHDL 4.2 10/04/2015 0300    VLDL 31 10/04/2015 0300   LDLCALC 124 (H) 10/04/2015 0300    Other Studies Reviewed Today:  Echocardiogram 08/19/2018 IMPRESSIONS  1. The left ventricle has normal systolic function, with an ejection  fraction of 55-60%. The cavity size was normal. Left ventricular diastolic  Doppler parameters are consistent with impaired relaxation.  2. The right ventricle has normal systolic function. The cavity was  normal. There is no increase in right ventricular wall thickness.  3. The mitral valve is abnormal. Mild thickening of the mitral valve  leaflet. Mild calcification of the mitral valve leaflet. There is mild  mitral annular calcification present. No evidence of mitral valve  stenosis.  4. The aortic valve has an indeterminate number of cusps. Mild thickening  of the aortic valve. Mild calcification of the aortic valve. Aortic valve  regurgitation is mild to moderate by color flow Doppler. No stenosis of  the aortic valve.  5. The aortic root is normal  in size Brian structure.  6. Pulmonary hypertension is indeterminant, inadequate TR jet.  7. The interatrial septum was not well visualized.    Cardiac catheterization 10/03/2015 Conclusion   Prox RCA lesion, 20% stenosed.  Prox Cx lesion, 20% stenosed.  Ost 2nd Diag lesion, 50% stenosed.  The left ventricular systolic function is normal.   1. Nonobstructive CAD 2. Normal LV function 3. Enlarged thoracic aorta c/w aneurysm.  Diagnostic Dominance: Right     Assessment Brian Plan:  1. CAD in native artery   2. Chronic systolic heart failure (HCC)   3. Ascending aortic aneurysm (HCC)   4. Essential hypertension   5. Bradycardia    1. CAD in native artery Cardiac cath 10/03/2015: Prox LAD 20%, Prox LCx 20%, Ost 2nd Diag 50%.  Denies any current progressive anginal or exertional symptoms.  Not on aspirin.  2. Chronic systolic heart failure (HCC) Echo 08/19/2018 : EF 55-60%. Impaired diastolic relaxation.  No recent  episodes of dyspnea, weight gain, or lower extremity edema.  3. Ascending aortic aneurysm (HCC) Last CT chest for aortic aneurysm was 2019.  Patient needs a repeat CTA for reevaluation of ascending aortic aneurysm.  4. Essential hypertension He is normotensive today with a blood pressure 118/78.  Continue lisinopril 5 mg daily.  5. Bradycardia EKG today shows sinus bradycardia with a rate of 53, LAD, RSR prime or QR pattern in V1 suggests right ventricular conduction delay.  He is asymptomatic  Medication Adjustments/Labs Brian Tests Ordered: Current medicines are reviewed at length with the patient today.  Concerns regarding medicines are outlined above.   Disposition: Follow-up with Dr. Diona Browner or APP 1 year  Signed, Rennis Harding, NP 10/18/2019 8:28 AM    [facility] [address] [place], [place], [place] [address] Phone: [phone]; Fax: [phone]

## 2019-10-18 ENCOUNTER — Telehealth: Payer: Self-pay | Admitting: Family Medicine

## 2019-10-18 ENCOUNTER — Encounter: Payer: Self-pay | Admitting: Family Medicine

## 2019-10-18 ENCOUNTER — Ambulatory Visit (INDEPENDENT_AMBULATORY_CARE_PROVIDER_SITE_OTHER): Payer: Medicare HMO | Admitting: Family Medicine

## 2019-10-18 VITALS — BP 118/78 | HR 54 | Ht 71.0 in | Wt 205.0 lb

## 2019-10-18 DIAGNOSIS — I7121 Aneurysm of the ascending aorta, without rupture: Secondary | ICD-10-CM

## 2019-10-18 DIAGNOSIS — I712 Thoracic aortic aneurysm, without rupture: Secondary | ICD-10-CM

## 2019-10-18 DIAGNOSIS — I10 Essential (primary) hypertension: Secondary | ICD-10-CM

## 2019-10-18 DIAGNOSIS — I5022 Chronic systolic (congestive) heart failure: Secondary | ICD-10-CM

## 2019-10-18 DIAGNOSIS — I2510 Atherosclerotic heart disease of native coronary artery without angina pectoris: Secondary | ICD-10-CM | POA: Diagnosis not present

## 2019-10-18 DIAGNOSIS — R001 Bradycardia, unspecified: Secondary | ICD-10-CM

## 2019-10-18 NOTE — Telephone Encounter (Signed)
Pre-cert Verification for the following procedure    CT ANGIO CHEST   DATE:      11/07/2019  LOCATION:[facility]

## 2019-10-18 NOTE — Patient Instructions (Addendum)
Medication Instructions:   Your physician recommends that you continue on your current medications as directed. Please refer to the Current Medication list given to you today.  Labwork:  Your physician recommends that you return for lab work in: TODAY to check your BMET. This may be done at [facility].  Testing/Procedures:  Non-Cardiac CT Angiography (CTA) chest and aorta, is a special type of CT scan that uses a computer to produce multi-dimensional views of major blood vessels throughout the body. In CT angiography, a contrast material is injected through an IV to help visualize the blood vessels  Follow-Up:  Your physician recommends that you schedule a follow-up appointment in: 1 year (office). You will receive a reminder letter in the mail in about 10 months reminding you to call and schedule your appointment. If you don't receive this letter, please contact our office.  Any Other Special Instructions Will Be Listed Below (If Applicable).  If you need a refill on your cardiac medications before your next appointment, please call your pharmacy.

## 2019-11-04 ENCOUNTER — Encounter: Payer: Self-pay | Admitting: *Deleted

## 2019-11-07 ENCOUNTER — Ambulatory Visit (HOSPITAL_COMMUNITY)
Admission: RE | Admit: 2019-11-07 | Discharge: 2019-11-07 | Disposition: A | Discharge status: Home / Self Care | Payer: Medicare HMO | Source: Ambulatory Visit | Attending: Family Medicine | Admitting: Family Medicine

## 2019-11-07 ENCOUNTER — Other Ambulatory Visit: Payer: Self-pay

## 2019-11-07 DIAGNOSIS — I10 Essential (primary) hypertension: Secondary | ICD-10-CM | POA: Diagnosis present

## 2019-11-07 DIAGNOSIS — I712 Thoracic aortic aneurysm, without rupture: Secondary | ICD-10-CM | POA: Diagnosis present

## 2019-11-07 DIAGNOSIS — I7121 Aneurysm of the ascending aorta, without rupture: Secondary | ICD-10-CM

## 2019-11-07 LAB — POCT I-STAT CREATININE: Creatinine, Ser: 1 mg/dL (ref 0.61–1.24)

## 2019-11-07 MED ORDER — IOHEXOL 350 MG/ML SOLN
100.0000 mL | Freq: Once | INTRAVENOUS | Status: AC | PRN
  Administered 2019-11-07: 100 mL via INTRAVENOUS

## 2019-11-07 NOTE — Progress Notes (Signed)
Spine stimulator was turned off by patient prior to appointment arrival.

## 2019-11-09 ENCOUNTER — Telehealth: Payer: Self-pay | Admitting: *Deleted

## 2019-11-09 NOTE — Telephone Encounter (Signed)
-----   Message from [name], NP sent at 11/07/2019  5:18 PM EDT ----- Thoracic aneurysm is 4.2 cm.  Tell the patient we will do annual CTs to check the size of the aneurysm.

## 2019-11-09 NOTE — Telephone Encounter (Signed)
Pt voiced understanding - routed to pcp   [name], NP sent to Eustace Moore, RN Basic metabolic panel looks good          [name], NP sent to Eustace Moore, RN Creatinine looks good

## 2020-01-13 ENCOUNTER — Other Ambulatory Visit: Payer: Self-pay | Admitting: *Deleted

## 2020-01-13 MED ORDER — ROSUVASTATIN CALCIUM 5 MG PO TABS: 5.0000 mg | ORAL_TABLET | Freq: Every day | ORAL | 3 refills | Status: DC

## 2020-01-17 ENCOUNTER — Other Ambulatory Visit: Payer: Self-pay | Admitting: *Deleted

## 2020-01-17 MED ORDER — ROSUVASTATIN CALCIUM 5 MG PO TABS: 5.0000 mg | ORAL_TABLET | Freq: Every day | ORAL | 3 refills | Status: DC

## 2021-01-03 ENCOUNTER — Ambulatory Visit: Payer: Medicare HMO | Admitting: Cardiology

## 2021-01-03 ENCOUNTER — Encounter: Payer: Self-pay | Admitting: *Deleted

## 2021-01-03 ENCOUNTER — Other Ambulatory Visit: Payer: Self-pay

## 2021-01-03 ENCOUNTER — Encounter: Payer: Self-pay | Admitting: Cardiology

## 2021-01-03 VITALS — BP 130/76 | HR 68 | Ht 71.0 in | Wt 214.6 lb

## 2021-01-03 DIAGNOSIS — I10 Essential (primary) hypertension: Secondary | ICD-10-CM | POA: Diagnosis not present

## 2021-01-03 DIAGNOSIS — I2510 Atherosclerotic heart disease of native coronary artery without angina pectoris: Secondary | ICD-10-CM | POA: Diagnosis not present

## 2021-01-03 DIAGNOSIS — R0602 Shortness of breath: Secondary | ICD-10-CM | POA: Diagnosis not present

## 2021-01-03 DIAGNOSIS — Z01812 Encounter for preprocedural laboratory examination: Secondary | ICD-10-CM

## 2021-01-03 DIAGNOSIS — I7121 Aneurysm of the ascending aorta, without rupture: Secondary | ICD-10-CM | POA: Diagnosis not present

## 2021-01-03 DIAGNOSIS — I519 Heart disease, unspecified: Secondary | ICD-10-CM

## 2021-01-03 MED ORDER — ASPIRIN EC 81 MG PO TBEC
81.0000 mg | DELAYED_RELEASE_TABLET | Freq: Every day | ORAL | Status: AC
Start: 2021-01-03 — End: ?

## 2021-01-03 NOTE — Progress Notes (Signed)
Clinical Summary Brian Mccormick is a 65 y.o.male former patient of Dr Purvis Sheffield, this is our first visit together. Seen for the following medical problems.   1.CAD/Mild nonobstructive - Coronary angiography on 10/03/15 showed nonobstructive coronary artery disease with a 20% proximal RCA lesion, 20% proximal circumflex lesion, and 50% ostial second diagonal lesion  - no recent chest pains.    2. LV systolic dysfunction 09/2015 echo EF 40-45%. - 07/2018 echo LVEF 55-60% - from notes did not tolerate beta blockers due to bradycardia  - some SOB at times, which is progressing - no recent edema - mild congestion this week  3. Ascending aortic aneurysm measured at 4.1 cm by CT angiography on 02/10/2017 at [facility]. - 10/2019 CTA 4.2 cm ascending aneurysm  4. Bradycardia -chronic, asymptoatmic 5. COPD - has inhaler at home, has albuterol prn.   Past Medical History:  Diagnosis Date   Abnormal nuclear stress test 09/25/15   Aortic aneurysm (HCC) dx'd ~ 2013   a. 4.1 cm ascending aortic aneurysm by CT in 09/2015   Arthritis    "hands" (10/03/2015)   CAD (coronary artery disease)    a. cath 09/2015: 50% stenosis Ost 2nd Diag, 20% prox Cx, 20% prox RCA   Chronic radicular pain of lower back    "goes into both legs" (10/03/2015)   GERD (gastroesophageal reflux disease)    "only when I was taking Morphine" (10/03/2015)   Pneumonia 1994   Racing heart beat ~ 2013     No Known Allergies   Current Outpatient Medications  Medication Sig Dispense Refill   colchicine 0.6 MG tablet Take 1 tablet (0.6 mg total) by mouth daily. 30 tablet 0   HYDROcodone-acetaminophen (NORCO) 10-325 MG tablet Take 1 tablet by mouth every 4 (four) hours.  0   lisinopril (PRINIVIL,ZESTRIL) 5 MG tablet TAKE 1 TABLET(5 MG) BY MOUTH DAILY 90 tablet 1   omeprazole (PRILOSEC) 20 MG capsule Take 20 mg by mouth daily as needed (for indigestion/heartburn).   2   polyethylene glycol powder (GLYCOLAX/MIRALAX)  powder Mix 17 grams with 6-8 ounces of water or juice and drink once every morning  2   rosuvastatin (CRESTOR) 5 MG tablet Take 1 tablet (5 mg total) by mouth daily. 90 tablet 3   No current facility-administered medications for this visit.     Past Surgical History:  Procedure Laterality Date   BACK SURGERY     CARDIAC CATHETERIZATION  10/03/2015   CARDIAC CATHETERIZATION N/A 10/03/2015   Procedure: Left Heart Cath and Coronary Angiography;  Surgeon: Peter M Swaziland, MD;  Location: [facility] INVASIVE CV LAB;  Service: Cardiovascular;  Laterality: N/A;   CERVICAL LAMINECTOMY  1996   C5   HERNIA REPAIR     LUMBAR LAMINECTOMY  1982   PAIN PUMP IMPLANTATION  ~ 2010   "Morphine"   PAIN PUMP REMOVAL  ~ 2015   POSTERIOR LUMBAR FUSION  1998   L4-5, S1   ROTATOR CUFF REPAIR Left 02/2018   SHOULDER ARTHROSCOPY WITH ROTATOR CUFF REPAIR AND OPEN BICEPS TENODESIS Right 03/2015   "massive biceps tear"   SPINAL CORD STIMULATOR IMPLANT  2003; ~ 2015   "TENS"   UMBILICAL HERNIA REPAIR  ~ 1978     No Known Allergies    Family History  Problem Relation Age of Onset   Heart attack Father    Arthritis Father    Arthritis Paternal Grandmother    Healthy Son      Social History  Brian Mccormick reports that he quit smoking about 16 years ago. His smoking use included cigarettes. He started smoking about 51 years ago. He has a 35.00 pack-year smoking history. He has never used smokeless tobacco. Brian Mccormick reports that he does not currently use alcohol.   Review of Systems CONSTITUTIONAL: No weight loss, fever, chills, weakness or fatigue.  HEENT: Eyes: No visual loss, blurred vision, double vision or yellow sclerae.No hearing loss, sneezing, congestion, runny nose or sore throat.  SKIN: No rash or itching.  CARDIOVASCULAR: per hpi RESPIRATORY: No shortness of breath, cough or sputum.  GASTROINTESTINAL: No anorexia, nausea, vomiting or diarrhea. No abdominal pain or blood.  GENITOURINARY: No burning  on urination, no polyuria NEUROLOGICAL: No headache, dizziness, syncope, paralysis, ataxia, numbness or tingling in the extremities. No change in bowel or bladder control.  MUSCULOSKELETAL: No muscle, back pain, joint pain or stiffness.  LYMPHATICS: No enlarged nodes. No history of splenectomy.  PSYCHIATRIC: No history of depression or anxiety.  ENDOCRINOLOGIC: No reports of sweating, cold or heat intolerance. No polyuria or polydipsia.  Marland Kitchen   Physical Examination Today's Vitals   01/03/21 0845  BP: 130/76  Pulse: 68  SpO2: 98%  Weight: 214 lb 9.6 oz (97.3 kg)  Height: 5\' 11"  (1.803 m)   Body mass index is 29.93 kg/m.  Gen: resting comfortably, no acute distress HEENT: no scleral icterus, pupils equal round and reactive, no palptable cervical adenopathy,  CV: RRR, no mr/g no jvd Resp: Clear to auscultation bilaterally GI: abdomen is soft, non-tender, non-distended, normal bowel sounds, no hepatosplenomegaly MSK: extremities are warm, no edema.  Skin: warm, no rash Neuro:  no focal deficits Psych: appropriate affect     Assessment and Plan  1.CAD - mild nonobstructive disease by prior cath - continue statin, start ASA 81mg  daily - EKG today shows SR, no ischemic changes  2. History of LV systolic dysfunction - mild dysfunction by echo in 2017, follow studies showed function had normalized - continue to monitir. Did not tolerate beta blockers due to low HRs  3. SOB - nonspecific SOB with walking up inclines, tolerates all other activities fine - monitor at this time. If progresses could consider repeat echo, possibly ischemic testing. He reports also history of COPD has prn inhaler. Since mild symptoms at this time would just monitor  4. Ascending aortic aneurysm - mild by most recent imaging, due for repeat CTA, will order  F/u 6 months   , M.D.

## 2021-01-03 NOTE — Patient Instructions (Addendum)
Medication Instructions:  Begin Aspirin 81mg  daily.  Continue all other medications.     Labwork: BMET - order given today.  Please do just prior to CTA.   Testing/Procedures: CTA of chest / aorta.  Office will contact with results via phone or letter.    Follow-Up: Your physician wants you to follow up in: 6 months.  You will receive a reminder letter in the mail one-two months in advance.  If you don't receive a letter, please call our office to schedule the follow up appointment.  Any Other Special Instructions Will Be Listed Below (If Applicable).   If you need a refill on your cardiac medications before your next appointment, please call your pharmacy.

## 2021-01-11 ENCOUNTER — Ambulatory Visit (HOSPITAL_COMMUNITY): Payer: Medicare HMO

## 2021-01-14 ENCOUNTER — Other Ambulatory Visit: Payer: Self-pay | Admitting: Family Medicine

## 2021-01-17 ENCOUNTER — Ambulatory Visit (HOSPITAL_COMMUNITY)
Admission: RE | Admit: 2021-01-17 | Discharge: 2021-01-17 | Disposition: A | Discharge status: Home / Self Care | Payer: Medicare HMO | Source: Ambulatory Visit | Attending: Cardiology | Admitting: Cardiology

## 2021-01-17 ENCOUNTER — Other Ambulatory Visit: Payer: Self-pay

## 2021-01-17 DIAGNOSIS — I7121 Aneurysm of the ascending aorta, without rupture: Secondary | ICD-10-CM | POA: Diagnosis not present

## 2021-01-17 LAB — POCT I-STAT CREATININE: Creatinine, Ser: 1 mg/dL (ref 0.61–1.24)

## 2021-01-17 MED ORDER — IOHEXOL 350 MG/ML SOLN
80.0000 mL | Freq: Once | INTRAVENOUS | Status: AC | PRN
  Administered 2021-01-17: 80 mL via INTRAVENOUS

## 2021-01-24 ENCOUNTER — Telehealth: Payer: Self-pay | Admitting: *Deleted

## 2021-01-24 NOTE — Telephone Encounter (Signed)
Lesle Chris, LPN  37/29/0211  3:01 PM EDT Back to Top    Notified, copy to pcp.

## 2021-01-24 NOTE — Telephone Encounter (Signed)
-----   Message from Antoine Poche, MD sent at 01/21/2021  4:04 PM EDT ----- ANeurysm remains mild, we will continue to monitor   Dominga Ferry MD

## 2021-09-27 ENCOUNTER — Encounter: Payer: Self-pay | Admitting: Neurology

## 2021-10-11 ENCOUNTER — Ambulatory Visit: Payer: Medicare HMO | Admitting: Cardiology

## 2021-10-11 ENCOUNTER — Encounter: Payer: Self-pay | Admitting: Cardiology

## 2021-10-11 NOTE — Progress Notes (Deleted)
Clinical Summary Brian Mccormick is a 66 y.o.male  1.CAD/Mild nonobstructive - Coronary angiography on 10/03/15 showed nonobstructive coronary artery disease with a 20% proximal RCA lesion, 20% proximal circumflex lesion, and 50% ostial second diagonal lesion   - no recent chest pains.      2. LV systolic dysfunction 09/2015 echo EF 40-45%. - 07/2018 echo LVEF 55-60% - from notes did not tolerate beta blockers due to bradycardia   - some SOB at times, which is progressing - no recent edema - mild congestion this week   3. Ascending aortic aneurysm measured at 4.1 cm by CT angiography on 02/10/2017 at [facility]. - 10/2019 CTA 4.2 cm ascending aneurysm 12/2020 CTA: 4.4 cm ascending aneurysm. Needs repeat 12/2021    4. Bradycardia -chronic, asymptoatmic 5. COPD - has inhaler at home, has albuterol prn.    Past Medical History:  Diagnosis Date   Abnormal nuclear stress test 09/25/15   Aortic aneurysm (HCC) dx'd ~ 2013   a. 4.1 cm ascending aortic aneurysm by CT in 09/2015   Arthritis    "hands" (10/03/2015)   CAD (coronary artery disease)    a. cath 09/2015: 50% stenosis Ost 2nd Diag, 20% prox Cx, 20% prox RCA   Chronic radicular pain of lower back    "goes into both legs" (10/03/2015)   GERD (gastroesophageal reflux disease)    "only when I was taking Morphine" (10/03/2015)   Pneumonia 1994   Racing heart beat ~ 2013     No Known Allergies   Current Outpatient Medications  Medication Sig Dispense Refill   aspirin EC 81 MG tablet Take 1 tablet (81 mg total) by mouth daily. Swallow whole.     colchicine 0.6 MG tablet Take 1 tablet (0.6 mg total) by mouth daily. 30 tablet 0   HYDROcodone-acetaminophen (NORCO) 10-325 MG tablet Take 1 tablet by mouth every 4 (four) hours.  0   lisinopril (PRINIVIL,ZESTRIL) 5 MG tablet TAKE 1 TABLET(5 MG) BY MOUTH DAILY 90 tablet 1   omeprazole (PRILOSEC) 20 MG capsule Take 20 mg by mouth daily as needed (for indigestion/heartburn).   2    polyethylene glycol powder (GLYCOLAX/MIRALAX) powder Mix 17 grams with 6-8 ounces of water or juice and drink once every morning  2   rosuvastatin (CRESTOR) 5 MG tablet TAKE 1 TABLET BY MOUTH EVERY DAY 90 tablet 3   No current facility-administered medications for this visit.     Past Surgical History:  Procedure Laterality Date   BACK SURGERY     CARDIAC CATHETERIZATION  10/03/2015   CARDIAC CATHETERIZATION N/A 10/03/2015   Procedure: Left Heart Cath and Coronary Angiography;  Surgeon: Peter M Swaziland, MD;  Location: [facility] INVASIVE CV LAB;  Service: Cardiovascular;  Laterality: N/A;   CERVICAL LAMINECTOMY  1996   C5   HERNIA REPAIR     LUMBAR LAMINECTOMY  1982   PAIN PUMP IMPLANTATION  ~ 2010   "Morphine"   PAIN PUMP REMOVAL  ~ 2015   POSTERIOR LUMBAR FUSION  1998   L4-5, S1   ROTATOR CUFF REPAIR Left 02/2018   SHOULDER ARTHROSCOPY WITH ROTATOR CUFF REPAIR AND OPEN BICEPS TENODESIS Right 03/2015   "massive biceps tear"   SPINAL CORD STIMULATOR IMPLANT  2003; ~ 2015   "TENS"   UMBILICAL HERNIA REPAIR  ~ 1978     No Known Allergies    Family History  Problem Relation Age of Onset   Heart attack Father    Arthritis Father  Arthritis Paternal Grandmother    Healthy Son      Social History Mr. Knudtson reports that he quit smoking about 17 years ago. His smoking use included cigarettes. He started smoking about 52 years ago. He has a 35.00 pack-year smoking history. He has never used smokeless tobacco. Mr. Bissonnette reports that he does not currently use alcohol.   Review of Systems CONSTITUTIONAL: No weight loss, fever, chills, weakness or fatigue.  HEENT: Eyes: No visual loss, blurred vision, double vision or yellow sclerae.No hearing loss, sneezing, congestion, runny nose or sore throat.  SKIN: No rash or itching.  CARDIOVASCULAR:  RESPIRATORY: No shortness of breath, cough or sputum.  GASTROINTESTINAL: No anorexia, nausea, vomiting or diarrhea. No abdominal pain or  blood.  GENITOURINARY: No burning on urination, no polyuria NEUROLOGICAL: No headache, dizziness, syncope, paralysis, ataxia, numbness or tingling in the extremities. No change in bowel or bladder control.  MUSCULOSKELETAL: No muscle, back pain, joint pain or stiffness.  LYMPHATICS: No enlarged nodes. No history of splenectomy.  PSYCHIATRIC: No history of depression or anxiety.  ENDOCRINOLOGIC: No reports of sweating, cold or heat intolerance. No polyuria or polydipsia.  Marland Kitchen   Physical Examination There were no vitals filed for this visit. There were no vitals filed for this visit.  Gen: resting comfortably, no acute distress HEENT: no scleral icterus, pupils equal round and reactive, no palptable cervical adenopathy,  CV Resp: Clear to auscultation bilaterally GI: abdomen is soft, non-tender, non-distended, normal bowel sounds, no hepatosplenomegaly MSK: extremities are warm, no edema.  Skin: warm, no rash Neuro:  no focal deficits Psych: appropriate affect   Diagnostic Studies  12/2020 CTA chest 1. Minimally enlarged fusiform ascending thoracic aortic aneurysm measuring up to 4.4 cm, previously 4.2 cm. Recommend annual imaging followup by CTA or MRA. This recommendation follows 2010 ACCF/AHA/AATS/ACR/ASA/SCA/SCAI/SIR/STS/SVM Guidelines for the Diagnosis and Management of Patients with Thoracic Aortic Disease. Circulation. 2010; 121: W098-J191. Aortic aneurysm NOS (ICD10-I71.9) 2. Coronary and aortic atherosclerosis (ICD10-I70.0).   Assessment and Plan   1.CAD - mild nonobstructive disease by prior cath - continue statin, start ASA 81mg  daily - EKG today shows SR, no ischemic changes   2. History of LV systolic dysfunction - mild dysfunction by echo in 2017, follow studies showed function had normalized - continue to monitor. Did not tolerate beta blockers due to low HRs   3. SOB - nonspecific SOB with walking up inclines, tolerates all other activities fine - monitor  at this time. If progresses could consider repeat echo, possibly ischemic testing. He reports also history of COPD has prn inhaler. Since mild symptoms at this time would just monitor   4. Ascending aortic aneurysm - mild by most recent imaging, due for repeat CTA, will order     2018, M.D., F.A.C.C.

## 2021-11-22 ENCOUNTER — Other Ambulatory Visit: Payer: Self-pay

## 2021-11-22 DIAGNOSIS — R202 Paresthesia of skin: Secondary | ICD-10-CM

## 2021-11-25 ENCOUNTER — Ambulatory Visit: Payer: Medicare HMO | Admitting: Neurology

## 2021-11-25 DIAGNOSIS — G5603 Carpal tunnel syndrome, bilateral upper limbs: Secondary | ICD-10-CM

## 2021-11-25 DIAGNOSIS — R202 Paresthesia of skin: Secondary | ICD-10-CM | POA: Diagnosis not present

## 2021-11-25 NOTE — Procedures (Signed)
[facility] Neurology  [address] [place], [address]  [place], [place] [address] Tel: [phone] Fax:  [phone] Test Date:  11/25/2021  Patient: Brian Mccormick DOB: Mar 15, 1956 Physician: Jacquelyne Balint, MD  Sex: Male Height: 5\' 11"  Ref Phys: , M.D.  ID#: Kirstie Peri   Technician:    Patient Complaints: This is a 66 year old male with numbness and weakness of bilateral hands.  NCV & EMG Findings: Extensive electrodiagnostic evaluation of the right and left upper extremities shows: Absent median sensory responses bilaterally. Ulnar sensory responses are within normal limits bilaterally. Absent median motor responses (recording at the abductor pollicis brevis) bilaterally. Ulnar motor responses (recording at the abductor digiti minimi) are within normal limits bilaterally. No motor units are seen on needle examination of bilateral abductor pollicis brevis muscles. There are sparse active denervation changes in the right abductor pollicis brevis muscle. There are chronic motor axon loss changes without accompanying active denervation changes in the left pronator teres and left triceps muscles.  Impression: This is an abnormal electrodiagnostic evaluation of bilateral upper extremities. The findings are most consistent with: Bilateral median mononeuropathy, likely at or distal to the wrist, consistent with carpal tunnel syndrome, severe in degree electrically bilaterally. The residuals of an old intraspinal canal lesion (i.e. motor radiculopathy) at the left C6-C7 roots or segments, mild in degree electrically.   ___________________________ 79, MD    Nerve Conduction Studies Anti Sensory Summary Table   Stim Site NR Peak (ms) Norm Peak (ms) P-T Amp (V) Norm O-P Amp  Left Median Anti Sensory (2nd Digit)  Wrist NR  <3.8  >10  Right Median Anti Sensory (2nd Digit)  34C  Wrist NR  <3.8  >10  Left Ulnar Anti Sensory (5th Digit)  Wrist    3.2 <3.2 11.0 >5  Right Ulnar  Anti Sensory (5th Digit)  34C  Wrist    3.1 <3.2 11.9 >5   Motor Summary Table   Stim Site NR Onset (ms) Norm Onset (ms) O-P Amp (mV) Norm O-P Amp Site1 Site2 Delta-0 (ms) Dist (cm) Vel (m/s) Norm Vel (m/s)  Left Median Motor (Abd Poll Brev)  Wrist NR  <4.0  >5 Elbow Wrist  0.0  >50  Elbow NR            Right Median Motor (Abd Poll Brev)  34C  Wrist NR  <4.0  >5 Elbow Wrist  0.0  >50  Elbow NR            Left Ulnar Motor (Abd Dig Minimi)  Wrist    2.3 <3.1 9.8 >7 B Elbow Wrist 4.2 25.0 60 >50  B Elbow    6.5  8.2  A Elbow B Elbow 2.0 10.0 50 >50  A Elbow    8.5  8.1         Right Ulnar Motor (Abd Dig Minimi)  34C  Wrist    2.3 <3.1 8.6 >7 B Elbow Wrist 4.2 24.5 58 >50  B Elbow    6.5  7.8  A Elbow B Elbow 1.9 10.0 53 >50  A Elbow    8.4  7.5          EMG   Side Muscle Ins Act Fibs Fasc Recrt Dur. Amp. Poly. Activation Comment  Right 1stDorInt Nml Nml Nml Nml Nml Nml Nml Nml N/A  Right Ext Indicis Nml Nml Nml Nml Nml Nml Nml Nml N/A  Right FlexPolLong Nml Nml Nml Nml Nml Nml Nml Nml N/A  Right Abd Poll Brev Nml 1+ Nml None - - - None ATR  Right PronatorTeres Nml Nml Nml Nml Nml Nml Nml Nml N/A  Right Biceps Nml Nml Nml Nml Nml Nml Nml Nml N/A  Right Triceps Nml Nml Nml Nml Nml Nml Nml Nml N/A  Right Deltoid Nml Nml Nml Nml Nml Nml Nml Nml N/A  Left 1stDorInt Nml Nml Nml Nml Nml Nml Nml Nml N/A  Left Ext Indicis Nml Nml Nml Nml Nml Nml Nml Nml N/A  Left FlexPolLong Nml Nml Nml Nml Nml Nml Nml Nml N/A  Left Abd Poll Brev 1- Nml Nml None - - - None ATR  Left PronatorTeres Nml Nml Nml 2- 1+ 1+ Nml Nml N/A  Left Biceps Nml Nml Nml Nml Nml Nml Nml Nml N/A  Left Triceps Nml Nml Nml 1- 1+ 1+ Nml Nml N/A  Left Deltoid Nml Nml Nml Nml Nml Nml Nml Nml N/A      Waveforms:

## 2021-12-13 ENCOUNTER — Telehealth: Payer: Self-pay | Admitting: Neurology

## 2021-12-13 NOTE — Telephone Encounter (Signed)
error 

## 2021-12-16 ENCOUNTER — Telehealth: Payer: Self-pay | Admitting: Cardiology

## 2021-12-16 NOTE — Telephone Encounter (Signed)
   Pre-operative Risk Assessment    Patient Name: Brian Mccormick Brooke Bonito.  DOB: [birth year] MRN: [id number]      Request for Surgical Clearance    Procedure:   Rt Carpal Tunnel Release  Date of Surgery:  Clearance TBD                                 Surgeon:  Earlie Server, M.D. Surgeon's Group or Practice Name:  [facility] Phone number: [phone]: [phone] Fax number: [phone]   Type of Clearance Requested:   - Medical    Type of Anesthesia:   Choice   Additional requests/questions:    SignedHipolito Bayley   12/16/2021, 4:03 PM

## 2021-12-17 NOTE — Telephone Encounter (Signed)
   Name: Brian Mccormick.  DOB: 1955/06/30  MRN: [id number]  Primary Cardiologist: Carlyle Dolly, MD  Chart reviewed as part of pre-operative protocol coverage. Because of [name]'s past medical history and time since last visit, he will require a follow-up in-office visit in order to better assess preoperative cardiovascular risk.  Pre-op covering staff: - Please schedule appointment and call patient to inform them. If patient already had an upcoming appointment within acceptable timeframe, please add "pre-op clearance" to the appointment notes so provider is aware. - Please contact requesting surgeon's office via preferred method (i.e, phone, fax) to inform them of need for appointment prior to surgery.  No medications are indicated as needing to be held.  Elgie Collard, PA-C  12/17/2021, 8:30 AM

## 2021-12-17 NOTE — Telephone Encounter (Signed)
Pt has been scheduled to see Estella Husk, PA-C, tomorrow, 12/18/21, clearance will be addressed at that time.  Will route back to the requesting surgeon's office to make them aware.

## 2021-12-17 NOTE — Progress Notes (Unsigned)
Cardiology Office Note:    Date:  12/18/2021   ID:  Brian Beat Almendarez Jr., DOB 06-06-1955, MRN [id number]  PCP:  Kirstie Peri, MD  [facility] Providers Cardiologist:  Dina Rich, MD     Referring MD: Kirstie Peri, MD   Chief Complaint:  No chief complaint on file.     History of Present Illness:   Brian Mccormick. is a 66 y.o. male with  history of CAD-Coronary angiography on 10/03/15 showed nonobstructive coronary artery disease with a 20% proximal RCA lesion, 20% proximal circumflex lesion, and 50% ostial second diagonal lesion, previous CM  LVEF improved 55-60% on echo 2020, Ascending thoracic aortic aneurysm 4.2 10/2019, Bradycardia, COPD.  Patient last saw Dr. Wyline Mood 12/2020 and had some SOB walking up inclines, if progresses repat echo, possible ischemic testing. Has COPD, Ascending Ao aneurysm 4.4 Repeat CTA 01/17/21      Patient comes in for Preop clearance for Rt carpal tunnel release by Dr. Frederico Hamman.  Patient denies chest pain, dyspnea, palpitations, edema. He works on cars-builds and restores.  Walks 1/4 mile daily, stays busy all day doing yard work, Automotive engineer and swims some.     Past Medical History:  Diagnosis Date   Abnormal nuclear stress test 09/25/15   Aortic aneurysm (HCC) dx'd ~ 2013   a. 4.1 cm ascending aortic aneurysm by CT in 09/2015   Arthritis    "hands" (10/03/2015)   CAD (coronary artery disease)    a. cath 09/2015: 50% stenosis Ost 2nd Diag, 20% prox Cx, 20% prox RCA   Chronic radicular pain of lower back    "goes into both legs" (10/03/2015)   GERD (gastroesophageal reflux disease)    "only when I was taking Morphine" (10/03/2015)   Pneumonia 1994   Racing heart beat ~ 2013   Current Medications: Current Meds  Medication Sig   aspirin EC 81 MG tablet Take 1 tablet (81 mg total) by mouth daily. Swallow whole.   esomeprazole (NEXIUM) 40 MG capsule Take 40 mg by mouth daily.   HYDROcodone-acetaminophen (NORCO) 10-325 MG tablet Take  1 tablet by mouth every 4 (four) hours.   lisinopril (PRINIVIL,ZESTRIL) 5 MG tablet TAKE 1 TABLET(5 MG) BY MOUTH DAILY   polyethylene glycol powder (GLYCOLAX/MIRALAX) powder Mix 17 grams with 6-8 ounces of water or juice and drink once every morning   pramipexole (MIRAPEX) 0.25 MG tablet Take 0.25 mg by mouth at bedtime.   rosuvastatin (CRESTOR) 5 MG tablet TAKE 1 TABLET BY MOUTH EVERY DAY    Allergies:   Patient has no known allergies.   Social History   Tobacco Use   Smoking status: Former    Packs/day: 1.00    Years: 35.00    Total pack years: 35.00    Types: Cigarettes    Start date: 04/12/1969    Quit date: 04/12/2004    Years since quitting: 17.6   Smokeless tobacco: Never  Vaping Use   Vaping Use: Never used  Substance Use Topics   Alcohol use: Not Currently    Alcohol/week: 0.0 standard drinks of alcohol    Comment: 10/03/2015 "nothing since 1997; never needed treatment"   Drug use: No    Family Hx: The patient's family history includes Arthritis in his father and paternal grandmother; Healthy in his son; Heart attack in his father.  ROS   EKGs/Labs/Other Test Reviewed:    EKG:  EKG is   ordered today.  The ekg ordered today demonstrates NSR with  IRBBB, LAFB, nonspecific ST changes, unchanged  Recent Labs: 01/17/2021: Creatinine, Ser 1.00   Recent Lipid Panel No results for input(s): "CHOL", "TRIG", "HDL", "VLDL", "LDLCALC", "LDLDIRECT" in the last 8760 hours.   Prior CV Studies: ECHO COMPLETE WO IMAGING ENHANCING AGENT 08/19/2018  Narrative ECHOCARDIOGRAM REPORT    Patient Name:   Brian Mccormick. Date of Exam: 08/19/2018 Medical Rec #:  [id number]          Height:       71.0 in Accession #:    [id number]         Weight:       202.0 lb Date of Birth:  10/11/1955          BSA:          2.12 m Patient Age:    45 years           BP:           112/73 mmHg Patient Gender: M                  HR:           64 bpm. Exam Location:  [facility]   Procedure: 2D Echo,  Cardiac Doppler and Color Doppler  Indications:     I42.9  History:         Patient has prior history of Echocardiogram examinations, most recent 10/04/2015. Cardiomyopathy Signs/Symptoms: Chest Pain Risk Factors: Hypertension, Dyslipidemia and Former Smoker.  Sonographer:     Jeneen Montgomery RDMS, RVT, RDCS Referring Phys:  Kate Sable, A Diagnosing Phys: Carlyle Dolly MD  IMPRESSIONS   1. The left ventricle has normal systolic function, with an ejection fraction of 55-60%. The cavity size was normal. Left ventricular diastolic Doppler parameters are consistent with impaired relaxation. 2. The right ventricle has normal systolic function. The cavity was normal. There is no increase in right ventricular wall thickness. 3. The mitral valve is abnormal. Mild thickening of the mitral valve leaflet. Mild calcification of the mitral valve leaflet. There is mild mitral annular calcification present. No evidence of mitral valve stenosis. 4. The aortic valve has an indeterminate number of cusps. Mild thickening of the aortic valve. Mild calcification of the aortic valve. Aortic valve regurgitation is mild to moderate by color flow Doppler. No stenosis of the aortic valve. 5. The aortic root is normal in size and structure. 6. Pulmonary hypertension is indeterminant, inadequate TR jet. 7. The interatrial septum was not well visualized.  FINDINGS Left Ventricle: The left ventricle has normal systolic function, with an ejection fraction of 55-60%. The cavity size was normal. There is no increase in left ventricular wall thickness. Left ventricular diastolic Doppler parameters are consistent with impaired relaxation. Normal left ventricular filling pressures  Right Ventricle: The right ventricle has normal systolic function. The cavity was normal. There is no increase in right ventricular wall thickness.  Left Atrium: Left atrial size was normal in size.  Right Atrium: Right atrial size was  normal in size. Right atrial pressure is estimated at 10 mmHg.  Interatrial Septum: The interatrial septum was not well visualized.  Pericardium: There is no evidence of pericardial effusion.  Mitral Valve: The mitral valve is abnormal. Mild thickening of the mitral valve leaflet. Mild calcification of the mitral valve leaflet. There is mild mitral annular calcification present. Mitral valve regurgitation is mild by color flow Doppler. No evidence of mitral valve stenosis.  Tricuspid Valve: The tricuspid valve is normal in structure. Tricuspid valve regurgitation  was not visualized by color flow Doppler.  Aortic Valve: The aortic valve has an indeterminate number of cusps Mild thickening of the aortic valve. Mild calcification of the aortic valve. Aortic valve regurgitation is mild to moderate by color flow Doppler. There is No stenosis of the aortic valve, with a calculated valve area of 1.59 cm.  Pulmonic Valve: The pulmonic valve was not well visualized. Pulmonic valve regurgitation is not visualized by color flow Doppler. No evidence of pulmonic stenosis.  Aorta: The aortic root is normal in size and structure.  Pulmonary Artery: Pulmonary hypertension is indeterminant, inadequate TR jet.  Compared to previous exam: Echocardiogram done 10/06/15 showed an EF of 45% and a dilated aortic root measuring 39 mm.   +--------------+--------++ LEFT VENTRICLE              +----------------+---------++ +--------------+--------++      Diastology                PLAX 2D                     +----------------+---------++ +--------------+--------++      LV e' lateral:  8.12 cm/s LVIDd:        5.36 cm       +----------------+---------++ +--------------+--------++      LV E/e' lateral:8.4       LVIDs:        3.07 cm       +----------------+---------++ +--------------+--------++      LV e' medial:   7.45 cm/s LV PW:        0.68 cm        +----------------+---------++ +--------------+--------++      LV E/e' medial: 9.2       LV IVS:       0.66 cm       +----------------+---------++ +--------------+--------++ LVOT diam:    2.10 cm  +--------------+--------++ LV SV:        102 ml   +--------------+--------++ LV SV Index:  47.17    +--------------+--------++ LVOT Area:    3.46 cm +--------------+--------++                        +--------------+--------++  +------------------+---------++ LV Volumes (MOD)            +------------------+---------++ LV area d, A2C:   44.80 cm +------------------+---------++ LV area d, A4C:   43.70 cm +------------------+---------++ LV area s, A2C:   24.70 cm +------------------+---------++ LV area s, A4C:   25.40 cm +------------------+---------++ LV major d, A2C:  9.16 cm   +------------------+---------++ LV major d, A4C:  9.08 cm   +------------------+---------++ LV major s, A2C:  7.54 cm   +------------------+---------++ LV major s, A4C:  7.72 cm   +------------------+---------++ LV vol d, MOD A2C:190.0 ml  +------------------+---------++ LV vol d, MOD A4C:171.0 ml  +------------------+---------++ LV vol s, MOD A2C:68.2 ml   +------------------+---------++ LV vol s, MOD A4C:74.1 ml   +------------------+---------++ LV SV MOD A2C:    121.8 ml  +------------------+---------++ LV SV MOD A4C:    171.0 ml  +------------------+---------++ LV SV MOD BP:     108.3 ml  +------------------+---------++  +---------------+----------++ RIGHT VENTRICLE           +---------------+----------++ RV S prime:    15.60 cm/s +---------------+----------++ TAPSE (M-mode):2.5 cm     +---------------+----------++  +-----------+-------++----------++ LEFT ATRIUM       Index      +-----------+-------++----------++ LA diam:   2.70 cm1.28  cm/m +-----------+-------++----------++ +------------------+------------++  AORTIC VALVE                   +------------------+------------++ AV Area (Vmax):   1.60 cm     +------------------+------------++ AV Area (Vmean):  1.46 cm     +------------------+------------++ AV Area (VTI):    1.59 cm     +------------------+------------++ AV Vmax:          194.75 cm/s  +------------------+------------++ AV Vmean:         141.000 cm/s +------------------+------------++ AV VTI:           0.452 m      +------------------+------------++ AV Peak Grad:     15.2 mmHg    +------------------+------------++ AV Mean Grad:     9.0 mmHg     +------------------+------------++ LVOT Vmax:        90.20 cm/s   +------------------+------------++ LVOT Vmean:       59.400 cm/s  +------------------+------------++ LVOT VTI:         0.207 m      +------------------+------------++ LVOT/AV VTI ratio:0.46         +------------------+------------++ AR PHT:           849 msec     +------------------+------------++  +-------------+-------++ AORTA                +-------------+-------++ Ao Root diam:3.90 cm +-------------+-------++  +--------------+----------++ +---------------+-----------++ MITRAL VALVE             TRICUSPID VALVE            +--------------+----------++ +---------------+-----------++ MV Area (PHT):3.50 cm   TR Peak grad:  5.9 mmHg    +--------------+----------++ +---------------+-----------++ MV PHT:       62.93 msec TR Vmax:       186.00 cm/s +--------------+----------++ +---------------+-----------++ MV Decel Time:217 msec   +--------------+----------++ +--------------+-------+ +-------------+-----------++ SHUNTS                MR Peak grad:107.0 mmHg  +--------------+-------+ +-------------+-----------++ Systemic VTI: 0.21 m  MR Vmax:     517.25 cm/s  +--------------+-------+ +-------------+-----------++ Systemic Diam:2.10 cm +--------------+----------++ +--------------+-------+ MV E velocity:68.40 cm/s +--------------+----------++ MV A velocity:93.10 cm/s +--------------+----------++ MV E/A ratio: 0.73       +--------------+----------++   Dina Rich MD Electronically signed by Dina Rich MD Signature Date/Time: 08/19/2018/9:33:25 AM    Final    CTA 01/17/21 IMPRESSION: Vascular:   1. Minimally enlarged fusiform ascending thoracic aortic aneurysm measuring up to 4.4 cm, previously 4.2 cm. Recommend annual imaging followup by CTA or MRA. This recommendation follows 2010 ACCF/AHA/AATS/ACR/ASA/SCA/SCAI/SIR/STS/SVM Guidelines for the Diagnosis and Management of Patients with Thoracic Aortic Disease. Circulation. 2010; 121: W098-J191. Aortic aneurysm NOS (ICD10-I71.9) 2. Coronary and aortic atherosclerosis (ICD10-I70.0).   Non-Vascular:   No acute intrathoracic abnormality.   Marliss Coots, MD   [facility]   [facility]     Electronically Signed   By: Marliss Coots M.D.   On: 01/17/2021 15:21    Risk Assessment/Calculations/Metrics:              Physical Exam:    VS:  BP 124/88   Pulse 63   Ht  (1.803 m)   Wt 215 lb (97.5 kg)   SpO2 99%   BMI 29.99 kg/m     Wt Readings from Last 3 Encounters:  12/18/21 215 lb (97.5 kg)  01/03/21 214 lb 9.6 oz (97.3 kg)  10/18/19 205 lb (93 kg)    Physical Exam  GEN: Well nourished, well developed, in no acute  distress  Neck: no JVD, carotid bruits, or masses Cardiac: RRR; no murmurs, rubs, or gallops  Respiratory:  clear to auscultation bilaterally, normal work of breathing GI: soft, nontender, nondistended, + BS Ext: without cyanosis, clubbing, or edema, Good distal pulses bilaterally Neuro:  Alert and Oriented x 3,  Psych: euthymic mood, full affect       ASSESSMENT & PLAN:    No problem-specific Assessment & Plan notes found for this encounter.   Preop clearance for Rt carpal tunnel release by Dr. Frederico Hamman. Patient without chest pain or cardiac complaints. Has CAD, ascending aortic aneurysm that have been stable. Can proceed with surgery without prior cardiac testing.   According to the Revised Cardiac Risk Index (RCRI), his Perioperative Risk of Major Cardiac Event is (%): 0.9  His Functional Capacity in METs is: 7.25 according to the [employer] Activity Status Index (DASI).   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 Check bmet and flp  Bradycardia-no BB or CCB. asymptomatic             Dispo:  No follow-ups on file.   Medication Adjustments/Labs and Tests Ordered: Current medicines are reviewed at length with the patient today.  Concerns regarding medicines are outlined above.  Tests Ordered: Orders Placed This Encounter  Procedures   CT ANGIO CHEST AORTA W/CM & OR WO/CM   Basic metabolic panel   Lipid panel   EKG 12-Lead   Medication Changes: No orders of the defined types were placed in this encounter.  Signed, Jacolyn Reedy, PA-C  12/18/2021 12:37 PM    [facility] [address] [place], [place], [place]  [address] Phone: [phone]; Fax: [phone]

## 2021-12-18 ENCOUNTER — Ambulatory Visit: Payer: Medicare HMO | Attending: Physician Assistant | Admitting: Physician Assistant

## 2021-12-18 ENCOUNTER — Encounter: Payer: Self-pay | Admitting: Physician Assistant

## 2021-12-18 VITALS — BP 124/88 | HR 63 | Ht 71.0 in | Wt 215.0 lb

## 2021-12-18 DIAGNOSIS — I7121 Aneurysm of the ascending aorta, without rupture: Secondary | ICD-10-CM | POA: Diagnosis not present

## 2021-12-18 DIAGNOSIS — I2510 Atherosclerotic heart disease of native coronary artery without angina pectoris: Secondary | ICD-10-CM

## 2021-12-18 DIAGNOSIS — Z01818 Encounter for other preprocedural examination: Secondary | ICD-10-CM | POA: Diagnosis not present

## 2021-12-18 DIAGNOSIS — I428 Other cardiomyopathies: Secondary | ICD-10-CM

## 2021-12-18 DIAGNOSIS — R001 Bradycardia, unspecified: Secondary | ICD-10-CM

## 2021-12-18 NOTE — Patient Instructions (Addendum)
Medication Instructions:  Your physician recommends that you continue on your current medications as directed. Please refer to the Current Medication list given to you today.  *If you need a refill on your cardiac medications before your next appointment, please call your pharmacy*   Lab Work: TODAY: BMET, [place]  If you have labs (blood work) drawn today and your tests are completely normal, you will receive your results only by: [place] (if you have MyChart) OR A paper copy in the mail If you have any lab test that is abnormal or we need to change your treatment, we will call you to review the results.   Testing/Procedures: Non-Cardiac CT Angiography (CTA), is a special type of CT scan that uses a computer to produce multi-dimensional views of major blood vessels throughout the body. In CT angiography, a contrast material is injected through an IV to help visualize the blood vessels    Follow-Up: At [facility], you and your health needs are our priority.  As part of our continuing mission to provide you with exceptional heart care, we have created designated Provider Care Teams.  These Care Teams include your primary Cardiologist (physician) and Advanced Practice Providers (APPs -  Physician Assistants and Nurse Practitioners) who all work together to provide you with the care you need, when you need it.   Your next appointment:   6 month(s)  The format for your next appointment:   In Person  Provider:   Carlyle Dolly, MD

## 2021-12-19 ENCOUNTER — Telehealth: Payer: Self-pay | Admitting: Physician Assistant

## 2021-12-19 DIAGNOSIS — Z79899 Other long term (current) drug therapy: Secondary | ICD-10-CM

## 2021-12-19 LAB — LIPID PANEL
Chol/HDL Ratio: 2.7 ratio (ref 0.0–5.0)
Cholesterol, Total: 171 mg/dL (ref 100–199)
HDL: 63 mg/dL (ref >39)
LDL Chol Calc (NIH): 83 mg/dL (ref 0–99)
Triglycerides: 147 mg/dL (ref 0–149)
VLDL Cholesterol Cal: 25 mg/dL (ref 5–40)

## 2021-12-19 LAB — BASIC METABOLIC PANEL
BUN/Creatinine Ratio: 14 (ref 10–24)
BUN: 17 mg/dL (ref 8–27)
CO2: 21 mmol/L (ref 20–29)
Calcium: 9.7 mg/dL (ref 8.6–10.2)
Chloride: 100 mmol/L (ref 96–106)
Creatinine, Ser: 1.19 mg/dL (ref 0.76–1.27)
Glucose: 90 mg/dL (ref 70–99)
Potassium: 4.7 mmol/L (ref 3.5–5.2)
Sodium: 137 mmol/L (ref 134–144)
eGFR: 67 mL/min/{1.73_m2} (ref >59)

## 2021-12-19 MED ORDER — ROSUVASTATIN CALCIUM 10 MG PO TABS: 10.0000 mg | ORAL_TABLET | Freq: Every day | ORAL | 3 refills | Status: DC

## 2021-12-19 NOTE — Telephone Encounter (Signed)
Patient's wife returning call for lab results. 

## 2021-12-19 NOTE — Telephone Encounter (Signed)
I spoke with wife and discussed lab results. They will increase his lipitor to 10 mg qd and repeat lipids in 3 mons.

## 2021-12-19 NOTE — Telephone Encounter (Signed)
-----   Message from Imogene Burn, PA-C sent at 12/19/2021  8:13 AM EDT ----- Labs look good. LDL 83 over goal of less than 70. Increase crestor 10 mg and repeat FLP in 3 months thanks

## 2021-12-26 ENCOUNTER — Ambulatory Visit (HOSPITAL_COMMUNITY)
Admission: RE | Admit: 2021-12-26 | Discharge: 2021-12-26 | Disposition: A | Discharge status: Home / Self Care | Payer: Medicare HMO | Source: Ambulatory Visit | Attending: Physician Assistant | Admitting: Physician Assistant

## 2021-12-26 DIAGNOSIS — I7121 Aneurysm of the ascending aorta, without rupture: Secondary | ICD-10-CM | POA: Diagnosis not present

## 2021-12-26 MED ORDER — IOHEXOL 350 MG/ML SOLN
100.0000 mL | Freq: Once | INTRAVENOUS | Status: AC | PRN
  Administered 2021-12-26: 100 mL via INTRAVENOUS

## 2022-01-14 ENCOUNTER — Other Ambulatory Visit: Payer: Self-pay | Admitting: Cardiology

## 2022-02-24 ENCOUNTER — Other Ambulatory Visit: Payer: Self-pay | Admitting: Cardiology

## 2022-04-07 ENCOUNTER — Ambulatory Visit: Payer: Medicare HMO | Attending: Cardiology | Admitting: Cardiology

## 2022-04-07 ENCOUNTER — Encounter: Payer: Self-pay | Admitting: Cardiology

## 2022-04-07 VITALS — BP 140/90 | HR 72 | Ht 71.0 in | Wt 228.4 lb

## 2022-04-07 DIAGNOSIS — I2510 Atherosclerotic heart disease of native coronary artery without angina pectoris: Secondary | ICD-10-CM | POA: Diagnosis not present

## 2022-04-07 DIAGNOSIS — I7121 Aneurysm of the ascending aorta, without rupture: Secondary | ICD-10-CM

## 2022-04-07 DIAGNOSIS — E782 Mixed hyperlipidemia: Secondary | ICD-10-CM | POA: Diagnosis not present

## 2022-04-07 DIAGNOSIS — I10 Essential (primary) hypertension: Secondary | ICD-10-CM | POA: Diagnosis not present

## 2022-04-07 MED ORDER — ROSUVASTATIN CALCIUM 20 MG PO TABS: 20.0000 mg | ORAL_TABLET | Freq: Every day | ORAL | 3 refills | Status: DC

## 2022-04-07 NOTE — Patient Instructions (Addendum)
Medication Instructions:  Increase Crestor to 20mg  daily  Continue all other medications.     Labwork: none  Testing/Procedures: none  Follow-Up: 6 months   Any Other Special Instructions Will Be Listed Below (If Applicable). Please call the office or send mychart message with update on your blood pressure readings on Friday.   If you need a refill on your cardiac medications before your next appointment, please call your pharmacy.

## 2022-04-07 NOTE — Progress Notes (Signed)
Clinical Summary Mr. Viens is a 67 y.o.male seen today for follow up of the following medical problems.   1.CAD/Mild nonobstructive - Coronary angiography on 10/03/15 showed nonobstructive coronary artery disease with a 20% proximal RCA lesion, 20% proximal circumflex lesion, and 50% ostial second diagonal lesion   - no chest pain, no SOB/DOE - compliant with meds     2. LV systolic dysfunction 06/5623 echo EF 40-45%. - 07/2018 echo LVEF 55-60% - from notes did not tolerate beta blockers due to bradycardia   - no SOB/DOE, no recent edema.     3. Ascending aortic aneurysm measured at 4.1 cm by CT angiography on 02/10/2017 at [facility]. - 10/2019 CTA 4.2 cm ascending aneurysm  11/2021 CTA chest 4.4 scm ascending aortic aneurysm stable   4. Bradycardia -chronic, asymptoatmic 5. COPD - has inhaler at home, has albuterol prn.   6. Hyperlipidemia - 11/2021 TC 171 TG 147 HDL 63 LDL 83 - based on this lab crestor was increased to 10mg   02/2022 LDL was still 83.   7. HTN - compliant with meds   Past Medical History:  Diagnosis Date   Abnormal nuclear stress test 09/25/15   Aortic aneurysm ([place]) dx'd ~ 2013   a. 4.1 cm ascending aortic aneurysm by CT in 09/2015   Arthritis    "hands" (10/03/2015)   CAD (coronary artery disease)    a. cath 09/2015: 50% stenosis Ost 2nd Diag, 20% prox Cx, 20% prox RCA   Chronic radicular pain of lower back    "goes into both legs" (10/03/2015)   GERD (gastroesophageal reflux disease)    "only when I was taking Morphine" (10/03/2015)   Pneumonia 1994   Racing heart beat ~ 2013     No Known Allergies   Current Outpatient Medications  Medication Sig Dispense Refill   aspirin EC 81 MG tablet Take 1 tablet (81 mg total) by mouth daily. Swallow whole.     esomeprazole (NEXIUM) 40 MG capsule Take 40 mg by mouth daily.     HYDROcodone-acetaminophen (NORCO) 10-325 MG tablet Take 1 tablet by mouth every 4 (four) hours.  0   lisinopril  (PRINIVIL,ZESTRIL) 5 MG tablet TAKE 1 TABLET(5 MG) BY MOUTH DAILY 90 tablet 1   polyethylene glycol powder (GLYCOLAX/MIRALAX) powder Mix 17 grams with 6-8 ounces of water or juice and drink once every morning  2   pramipexole (MIRAPEX) 0.25 MG tablet Take 0.25 mg by mouth at bedtime.     rosuvastatin (CRESTOR) 10 MG tablet Take 1 tablet (10 mg total) by mouth daily. 90 tablet 3   No current facility-administered medications for this visit.     Past Surgical History:  Procedure Laterality Date   BACK SURGERY     CARDIAC CATHETERIZATION  10/03/2015   CARDIAC CATHETERIZATION N/A 10/03/2015   Procedure: Left Heart Cath and Coronary Angiography;  Surgeon: Peter M Martinique, MD;  Location: [place] [facility];  Service: Cardiovascular;  Laterality: N/A;   CERVICAL LAMINECTOMY  1996   C5   HERNIA REPAIR     LUMBAR LAMINECTOMY  1982   PAIN PUMP IMPLANTATION  ~ 2010   "Morphine"   PAIN PUMP REMOVAL  ~ 2015   POSTERIOR LUMBAR FUSION  1998   L4-5, S1   ROTATOR CUFF REPAIR Left 02/2018   SHOULDER ARTHROSCOPY WITH ROTATOR CUFF REPAIR AND OPEN BICEPS TENODESIS Right 03/2015   "massive biceps tear"   SPINAL CORD STIMULATOR IMPLANT  2003; ~ 2015   "  TENS"   UMBILICAL HERNIA REPAIR  ~ 1978     No Known Allergies    Family History  Problem Relation Age of Onset   Heart attack Father    Arthritis Father    Arthritis Paternal Grandmother    Healthy Son      Social History 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 His smoking use included cigarettes. He started smoking about 53 years ago. He has a 35.00 pack-year smoking history. He has never used smokeless tobacco. Mr. Risdon reports that he does not currently use alcohol.   Review of Systems CONSTITUTIONAL: No weight loss, fever, chills, weakness or fatigue.  HEENT: Eyes: No visual loss, blurred vision, double vision or yellow sclerae.No hearing loss, sneezing, congestion, runny nose or sore throat.  SKIN: No rash or  itching.  CARDIOVASCULAR: per hpi RESPIRATORY: No shortness of breath, cough or sputum.  GASTROINTESTINAL: No anorexia, nausea, vomiting or diarrhea. No abdominal pain or blood.  GENITOURINARY: No burning on urination, no polyuria NEUROLOGICAL: No headache, dizziness, syncope, paralysis, ataxia, numbness or tingling in the extremities. No change in bowel or bladder control.  MUSCULOSKELETAL: No muscle, back pain, joint pain or stiffness.  LYMPHATICS: No enlarged nodes. No history of splenectomy.  PSYCHIATRIC: No history of depression or anxiety.  ENDOCRINOLOGIC: No reports of sweating, cold or heat intolerance. No polyuria or polydipsia.  Marland Kitchen   Physical Examination Today's Vitals   04/07/22 1028  BP: (!) 142/90  Pulse: 72  SpO2: 98%  Weight: 228 lb 6.4 oz (103.6 kg)  Height: 5\' 11"  (1.803 m)   Body mass index is 31.86 kg/m.  Gen: resting comfortably, no acute distress HEENT: no scleral icterus, pupils equal round and reactive, no palptable cervical adenopathy,  CV: RRR, no mrg, no jvd Resp: Clear to auscultation bilaterally GI: abdomen is soft, non-tender, non-distended, normal bowel sounds, no hepatosplenomegaly MSK: extremities are warm, no edema.  Skin: warm, no rash Neuro:  no focal deficits Psych: appropriate affect   Diagnostic Studies  07/2018 echo 1. The left ventricle has normal systolic function, with an ejection  fraction of 55-60%. The cavity size was normal. Left ventricular diastolic  Doppler parameters are consistent with impaired relaxation.   2. The right ventricle has normal systolic function. The cavity was  normal. There is no increase in right ventricular wall thickness.   3. The mitral valve is abnormal. Mild thickening of the mitral valve  leaflet. Mild calcification of the mitral valve leaflet. There is mild  mitral annular calcification present. No evidence of mitral valve  stenosis.   4. The aortic valve has an indeterminate number of cusps. Mild  thickening  of the aortic valve. Mild calcification of the aortic valve. Aortic valve  regurgitation is mild to moderate by color flow Doppler. No stenosis of  the aortic valve.   5. The aortic root is normal in size and structure.   6. Pulmonary hypertension is indeterminant, inadequate TR jet.   7. The interatrial septum was not well visualized.    11/2021 CTA chest IMPRESSION: 1. Mild fusiform enlargement of the ascending thoracic aorta, measuring 4.4 cm, unchanged from the most recent prior study. Recommend annual imaging followup by CTA or MRA. This recommendation follows 2010 ACCF/AHA/AATS/ACR/ASA/SCA/SCAI/SIR/STS/SVM Guidelines for the Diagnosis and Management of Patients with Thoracic Aortic Disease. Circulation. 2010; 1212011. Aortic aneurysm NOS (ICD10-I71.9) 2. No acute findings within the chest. 3. Coronary artery atherosclerosis.   Assessment and Plan   1.CAD -  mild nonobstructive disease by prior cath - no symptoms - continue current meds   2. History of LV systolic dysfunction - mild dysfunction by echo in 2017, follow studies showed function had normalized - Did not tolerate beta blockers due to low HRs - no symptoms, continue to monitor    3. Ascending aortic aneurysm - mild by most recent imaging - repeat CTA fall 2024  4. HTN - above goal, well controlled just 4 months ago though from chart has gained about 13 lbs since then.  - call us Friday with home bp's, if elevated then increase lisinopril to 20mg  daily  5. Hyperlipidemia - LDL remains above goal, increase crestor to 20mg  daily.   F/u 6 months   , M.D.

## 2022-05-19 ENCOUNTER — Other Ambulatory Visit: Payer: Self-pay | Admitting: Cardiology

## 2022-06-02 ENCOUNTER — Telehealth: Payer: Self-pay | Admitting: Cardiology

## 2022-06-02 NOTE — Telephone Encounter (Signed)
  Pt's wife calling, she requesting to send all pt's OV notes to Dr. Manuella Ghazi

## 2022-06-02 NOTE — Telephone Encounter (Signed)
04/07/22 OV faxed to Dr.Shah

## 2022-06-27 ENCOUNTER — Other Ambulatory Visit: Payer: Self-pay | Admitting: Cardiology

## 2022-10-15 ENCOUNTER — Encounter: Payer: Self-pay | Admitting: Cardiology

## 2022-10-15 ENCOUNTER — Ambulatory Visit: Payer: Medicare HMO | Attending: Cardiology | Admitting: Cardiology

## 2022-10-15 VITALS — BP 120/80 | HR 62 | Ht 71.0 in | Wt 220.6 lb

## 2022-10-15 DIAGNOSIS — E782 Mixed hyperlipidemia: Secondary | ICD-10-CM

## 2022-10-15 DIAGNOSIS — I2510 Atherosclerotic heart disease of native coronary artery without angina pectoris: Secondary | ICD-10-CM | POA: Diagnosis not present

## 2022-10-15 DIAGNOSIS — I7121 Aneurysm of the ascending aorta, without rupture: Secondary | ICD-10-CM

## 2022-10-15 DIAGNOSIS — I10 Essential (primary) hypertension: Secondary | ICD-10-CM

## 2022-10-15 DIAGNOSIS — I5032 Chronic diastolic (congestive) heart failure: Secondary | ICD-10-CM

## 2022-10-15 DIAGNOSIS — I712 Thoracic aortic aneurysm, without rupture, unspecified: Secondary | ICD-10-CM

## 2022-10-15 NOTE — Patient Instructions (Addendum)
Medication Instructions:  Your physician recommends that you continue on your current medications as directed. Please refer to the Current Medication list given to you today.   Labwork: BMET within 6 weeks of CTA Angio Lipid-fasting   Testing/Procedures: CT Angio Chest to be done at [facility]  Follow-Up: Your physician recommends that you schedule a follow-up appointment in: 6 months  Any Other Special Instructions Will Be Listed Below (If Applicable).  If you need a refill on your cardiac medications before your next appointment, please call your pharmacy.

## 2022-10-15 NOTE — Progress Notes (Signed)
Clinical Summary Brian Mccormick is a 67 y.o.male seen today for follow up of the following medical problems.      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 - 07/2018 echo LVEF 55-60% - from notes did not tolerate beta blockers due to bradycardia   - denies any SOB/DOE, no LE edema     3. Ascending aortic aneurysm measured at 4.1 cm by CT angiography on 02/10/2017 at [facility]. - 10/2019 CTA 4.2 cm ascending aneurysm   11/2021 CTA chest 4.4 scm ascending aortic aneurysm stable   4. Bradycardia -chronic, asymptoatmic  5. COPD - has inhaler at home, has albuterol prn.    6. Hyperlipidemia - 11/2021 TC 171 TG 147 HDL 63 LDL 83 - based on this lab crestor was increased to 10mg   02/2022 LDL was still 83.    7. HTN - compliant with meds Past Medical History:  Diagnosis Date   Abnormal nuclear stress test 09/25/15   Aortic aneurysm ([facility]) dx'd ~ 2013   a. 4.1 cm ascending aortic aneurysm by CT in 09/2015   Arthritis    "hands" (10/03/2015)   CAD (coronary artery disease)    a. cath 09/2015: 50% stenosis Ost 2nd Diag, 20% prox Cx, 20% prox RCA   Chronic radicular pain of lower back    "goes into both legs" (10/03/2015)   GERD (gastroesophageal reflux disease)    "only when I was taking Morphine" (10/03/2015)   Pneumonia 1994   Racing heart beat ~ 2013     No Known Allergies   Current Outpatient Medications  Medication Sig Dispense Refill   aspirin EC 81 MG tablet Take 1 tablet (81 mg total) by mouth daily. Swallow whole.     esomeprazole (NEXIUM) 40 MG capsule Take 40 mg by mouth daily.     HYDROcodone-acetaminophen (NORCO) 10-325 MG tablet Take 1 tablet by mouth every 4 (four) hours.  0   lisinopril  (ZESTRIL) 10 MG tablet Take 10 mg by mouth daily.     polyethylene glycol powder (GLYCOLAX/MIRALAX) powder Mix 17 grams with 6-8 ounces of water or juice and drink once every morning  2   pramipexole (MIRAPEX) 0.25 MG tablet Take 0.25 mg by mouth at bedtime.     rosuvastatin (CRESTOR) 20 MG tablet Take 1 tablet (20 mg total) by mouth daily. 90 tablet 3   No current facility-administered medications for this visit.     Past Surgical History:  Procedure Laterality Date   BACK SURGERY     CARDIAC CATHETERIZATION  10/03/2015   CARDIAC CATHETERIZATION N/A 10/03/2015   Procedure: Left Heart Cath and Coronary Angiography;  Surgeon: Peter M Swaziland, MD;  Location: [facility] INVASIVE CV LAB;  Service: Cardiovascular;  Laterality: N/A;   CERVICAL LAMINECTOMY  1996   C5   HERNIA REPAIR     LUMBAR LAMINECTOMY  1982   PAIN PUMP IMPLANTATION  ~ 2010   "Morphine"   PAIN PUMP REMOVAL  ~ 2015   POSTERIOR LUMBAR FUSION  1998   L4-5, S1   ROTATOR CUFF REPAIR Left 02/2018   SHOULDER ARTHROSCOPY WITH ROTATOR CUFF REPAIR AND OPEN BICEPS TENODESIS Right 03/2015   "massive biceps tear"   SPINAL CORD STIMULATOR IMPLANT  2003; ~  2015   "TENS"   UMBILICAL HERNIA REPAIR  ~ 1978     No Known Allergies    Family History  Problem Relation Age of Onset   Heart attack Father    Arthritis Father    Arthritis Paternal Grandmother    Healthy Son      Social History Mr. Scientist, research (medical) reports that he quit smoking about 18 years ago. His smoking use included cigarettes. He started smoking about 53 years ago. He has a 35 pack-year smoking history. He has never used smokeless tobacco. Mr. Spaziani reports that he does not currently use alcohol.   Review of Systems CONSTITUTIONAL: No weight loss, fever, chills, weakness or fatigue.  HEENT: Eyes: No visual loss, blurred vision, double vision or yellow sclerae.No hearing loss, sneezing, congestion, runny nose or sore throat.  SKIN: No rash or itching.  CARDIOVASCULAR: per  hpi RESPIRATORY: No shortness of breath, cough or sputum.  GASTROINTESTINAL: No anorexia, nausea, vomiting or diarrhea. No abdominal pain or blood.  GENITOURINARY: No burning on urination, no polyuria NEUROLOGICAL: No headache, dizziness, syncope, paralysis, ataxia, numbness or tingling in the extremities. No change in bowel or bladder control.  MUSCULOSKELETAL: No muscle, back pain, joint pain or stiffness.  LYMPHATICS: No enlarged nodes. No history of splenectomy.  PSYCHIATRIC: No history of depression or anxiety.  ENDOCRINOLOGIC: No reports of sweating, cold or heat intolerance. No polyuria or polydipsia.  Marland Kitchen   Physical Examination Today's Vitals   10/15/22 1122  BP: 120/80  Pulse: 62  SpO2: 97%  Weight: 220 lb 9.6 oz (100.1 kg)  Height: 5\' 11"  (1.803 m)   Body mass index is 30.77 kg/m.  Gen: resting comfortably, no acute distress HEENT: no scleral icterus, pupils equal round and reactive, no palptable cervical adenopathy,  CV: RRR, no mrg, no jvd Resp: Clear to auscultation bilaterally GI: abdomen is soft, non-tender, non-distended, normal bowel sounds, no hepatosplenomegaly MSK: extremities are warm, no edema.  Skin: warm, no rash Neuro:  no focal deficits Psych: appropriate affect   Diagnostic Studies  07/2018 echo 1. The left ventricle has normal systolic function, with an ejection  fraction of 55-60%. The cavity size was normal. Left ventricular diastolic  Doppler parameters are consistent with impaired relaxation.   2. The right ventricle has normal systolic function. The cavity was  normal. There is no increase in right ventricular wall thickness.   3. The mitral valve is abnormal. Mild thickening of the mitral valve  leaflet. Mild calcification of the mitral valve leaflet. There is mild  mitral annular calcification present. No evidence of mitral valve  stenosis.   4. The aortic valve has an indeterminate number of cusps. Mild thickening  of the aortic valve.  Mild calcification of the aortic valve. Aortic valve  regurgitation is mild to moderate by color flow Doppler. No stenosis of  the aortic valve.   5. The aortic root is normal in size and structure.   6. Pulmonary hypertension is indeterminant, inadequate TR jet.   7. The interatrial septum was not well visualized.      11/2021 CTA chest IMPRESSION: 1. Mild fusiform enlargement of the ascending thoracic aorta, measuring 4.4 cm, unchanged from the most recent prior study. Recommend annual imaging followup by CTA or MRA. This recommendation follows 2010 ACCF/AHA/AATS/ACR/ASA/SCA/SCAI/SIR/STS/SVM Guidelines for the Diagnosis and Management of Patients with Thoracic Aortic Disease. Circulation. 2010; 121: Z610-R604. Aortic aneurysm NOS (ICD10-I71.9) 2. No acute findings within the chest. 3. Coronary artery atherosclerosis.   Assessment and Plan  1.CAD - mild nonobstructive disease by prior cath -denies any symptoms, continue current meds   2. HFimpEF - mild dysfunction by echo in 2017, follow studies showed function had normalized - Did not tolerate beta blockers due to low HRs - no recent symptoms, continue current meds     3. Ascending aortic aneurysm - mild by most recent imaging - repeat CTA 11/2022   4. HTN - at goal, continue current meds   5. Hyperlipidemia -recheck lipid panel we had increased his crestor last visit  F/u 6 months      Antoine Poche, M.D.

## 2023-01-15 ENCOUNTER — Ambulatory Visit (HOSPITAL_COMMUNITY)
Admission: RE | Admit: 2023-01-15 | Discharge: 2023-01-15 | Disposition: A | Discharge status: Home / Self Care | Payer: Medicare HMO | Source: Ambulatory Visit | Attending: Cardiology | Admitting: Cardiology

## 2023-01-15 DIAGNOSIS — I712 Thoracic aortic aneurysm, without rupture, unspecified: Secondary | ICD-10-CM | POA: Insufficient documentation

## 2023-01-15 MED ORDER — IOHEXOL 350 MG/ML SOLN
100.0000 mL | Freq: Once | INTRAVENOUS | Status: AC | PRN
  Administered 2023-01-15: 100 mL via INTRAVENOUS

## 2023-02-16 ENCOUNTER — Encounter: Payer: Self-pay | Admitting: *Deleted

## 2023-05-13 ENCOUNTER — Other Ambulatory Visit: Payer: Self-pay | Admitting: Cardiology

## 2023-07-14 ENCOUNTER — Encounter: Payer: Self-pay | Admitting: *Deleted

## 2023-07-16 ENCOUNTER — Ambulatory Visit: Payer: Medicare HMO | Admitting: Cardiology

## 2023-07-16 NOTE — Progress Notes (Deleted)
 Clinical Summary Mr. Pelley is a 68 y.o.male  seen today for follow up of the following medical problems.        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 - 07/2018 echo LVEF 55-60% - from notes did not tolerate beta blockers due to bradycardia   - denies any SOB/DOE, no LE edema     3. Ascending aortic aneurysm measured at 4.1 cm by CT angiography on 02/10/2017 at [facility]. - 10/2019 CTA 4.2 cm ascending aneurysm   11/2021 CTA chest 4.4 scm ascending aortic aneurysm stable   4. Bradycardia -chronic, asymptoatmic   5. COPD - has inhaler at home, has albuterol prn.    6. Hyperlipidemia - 11/2021 TC 171 TG 147 HDL 63 LDL 83 - based on this lab crestor was increased to 10mg   02/2022 LDL was still 83.    7. HTN - compliant with meds Past Medical History:  Diagnosis Date   Abnormal nuclear stress test 09/25/15   Aortic aneurysm ([facility]) dx'd ~ 2013   a. 4.1 cm ascending aortic aneurysm by CT in 09/2015   Arthritis    "hands" (10/03/2015)   CAD (coronary artery disease)    a. cath 09/2015: 50% stenosis Ost 2nd Diag, 20% prox Cx, 20% prox RCA   Chronic radicular pain of lower back    "goes into both legs" (10/03/2015)   GERD (gastroesophageal reflux disease)    "only when I was taking Morphine" (10/03/2015)   Pneumonia 1994   Racing heart beat ~ 2013     No Known Allergies   Current Outpatient Medications  Medication Sig Dispense Refill   aspirin EC 81 MG tablet Take 1 tablet (81 mg total) by mouth daily. Swallow whole.     esomeprazole (NEXIUM) 40 MG capsule Take 40 mg by mouth daily.     HYDROcodone-acetaminophen (NORCO) 10-325 MG tablet Take 1 tablet by mouth every 4 (four) hours.  0   lisinopril  (ZESTRIL) 10 MG tablet Take 10 mg by mouth daily.     polyethylene glycol powder (GLYCOLAX/MIRALAX) powder Mix 17 grams with 6-8 ounces of water or juice and drink once every morning  2   pramipexole (MIRAPEX) 0.25 MG tablet Take 0.25 mg by mouth at bedtime.     rosuvastatin (CRESTOR) 20 MG tablet TAKE 1 TABLET BY MOUTH EVERY DAY 90 tablet 3   No current facility-administered medications for this visit.     Past Surgical History:  Procedure Laterality Date   BACK SURGERY     CARDIAC CATHETERIZATION  10/03/2015   CARDIAC CATHETERIZATION N/A 10/03/2015   Procedure: Left Heart Cath and Coronary Angiography;  Surgeon: Peter M Swaziland, MD;  Location: [facility] INVASIVE CV LAB;  Service: Cardiovascular;  Laterality: N/A;   CERVICAL LAMINECTOMY  1996   C5   HERNIA REPAIR     LUMBAR LAMINECTOMY  1982   PAIN PUMP IMPLANTATION  ~ 2010   "Morphine"   PAIN PUMP REMOVAL  ~ 2015   POSTERIOR LUMBAR FUSION  1998   L4-5, S1   ROTATOR CUFF REPAIR Left 02/2018   SHOULDER ARTHROSCOPY WITH ROTATOR CUFF REPAIR AND OPEN BICEPS TENODESIS Right 03/2015   "massive biceps tear"   SPINAL CORD STIMULATOR IMPLANT  2003; ~ 2015   "TENS"   UMBILICAL HERNIA REPAIR  ~ 1978     No Known Allergies    Family History  Problem Relation Age of Onset   Heart attack Father    Arthritis Father    Arthritis Paternal Grandmother    Healthy Son      Social History Mr. Scientist, research (medical) reports that he quit smoking about 19 years ago. His smoking use included cigarettes. He started smoking about 54 years ago. He has a 35 pack-year smoking history. He has never used smokeless tobacco. Mr. Spruell reports that he does not currently use alcohol.   Review of Systems CONSTITUTIONAL: No weight loss, fever, chills, weakness or fatigue.  HEENT: Eyes: No visual loss, blurred vision, double vision or yellow sclerae.No hearing loss, sneezing, congestion, runny nose or sore throat.  SKIN: No rash or itching.  CARDIOVASCULAR:  RESPIRATORY:  No shortness of breath, cough or sputum.  GASTROINTESTINAL: No anorexia, nausea, vomiting or diarrhea. No abdominal pain or blood.  GENITOURINARY: No burning on urination, no polyuria NEUROLOGICAL: No headache, dizziness, syncope, paralysis, ataxia, numbness or tingling in the extremities. No change in bowel or bladder control.  MUSCULOSKELETAL: No muscle, back pain, joint pain or stiffness.  LYMPHATICS: No enlarged nodes. No history of splenectomy.  PSYCHIATRIC: No history of depression or anxiety.  ENDOCRINOLOGIC: No reports of sweating, cold or heat intolerance. No polyuria or polydipsia.  Aaron Aas   Physical Examination There were no vitals filed for this visit. There were no vitals filed for this visit.  Gen: resting comfortably, no acute distress HEENT: no scleral icterus, pupils equal round and reactive, no palptable cervical adenopathy,  CV Resp: Clear to auscultation bilaterally GI: abdomen is soft, non-tender, non-distended, normal bowel sounds, no hepatosplenomegaly MSK: extremities are warm, no edema.  Skin: warm, no rash Neuro:  no focal deficits Psych: appropriate affect   Diagnostic Studies  07/2018 echo 1. The left ventricle has normal systolic function, with an ejection  fraction of 55-60%. The cavity size was normal. Left ventricular diastolic  Doppler parameters are consistent with impaired relaxation.   2. The right ventricle has normal systolic function. The cavity was  normal. There is no increase in right ventricular wall thickness.   3. The mitral valve is abnormal. Mild thickening of the mitral valve  leaflet. Mild calcification of the mitral valve leaflet. There is mild  mitral annular calcification present. No evidence of mitral valve  stenosis.   4. The aortic valve has an indeterminate number of cusps. Mild thickening  of the aortic valve. Mild calcification of the aortic valve. Aortic valve  regurgitation is mild to moderate by color flow Doppler. No  stenosis of  the aortic valve.   5. The aortic root is normal in size and structure.   6. Pulmonary hypertension is indeterminant, inadequate TR jet.   7. The interatrial septum was not well visualized.      11/2021 CTA chest IMPRESSION: 1. Mild fusiform enlargement of the ascending thoracic aorta, measuring 4.4 cm, unchanged from the most recent prior study. Recommend annual imaging followup by CTA or MRA. This recommendation follows 2010 ACCF/AHA/AATS/ACR/ASA/SCA/SCAI/SIR/STS/SVM Guidelines for the Diagnosis and Management of Patients with Thoracic Aortic Disease. Circulation. 2010; 121: Z610-R604. Aortic aneurysm NOS (ICD10-I71.9) 2. No acute findings within the chest. 3. Coronary artery atherosclerosis.   Assessment and Plan   1.CAD - mild nonobstructive disease by prior cath -denies any symptoms, continue current meds   2. HFimpEF - mild dysfunction by echo  in 2017, follow studies showed function had normalized - Did not tolerate beta blockers due to low HRs - no recent symptoms, continue current meds     3. Ascending aortic aneurysm - mild by most recent imaging - repeat CTA 11/2022   4. HTN - at goal, continue current meds   5. Hyperlipidemia -recheck lipid panel we had increased his crestor last visit     Laurann Pollock, M.D., F.A.C.C.

## 2023-08-09 IMAGING — CT CTA CHEST W/ AND/OR W/O CM W/ OR W/O DISSECTION AND GATING
1 of 10 series · 3 of 16 positions shown, 4 images · IV contrast (omnipaque)
Comparison: 11/07/2019

CLINICAL DATA: 65-year-old male with history of ascending thoracic
aortic aneurysm. Follow-up study.

EXAM:
CT ANGIOGRAPHY CHEST WITH CONTRAST
TECHNIQUE: Multidetector CT imaging of the chest was performed using the
standard protocol during bolus administration of intravenous
contrast. Multiplanar CT image reconstructions and MIPs were
obtained to evaluate the vascular anatomy.
CONTRAST:  Eighty mL Omnipaque 350, intravenous

[Series 8: arterial thins · axial · arterial · 0.76mm/px · z∈[+1129,+1284]mm · 3 of 515 slices shown, 4 images]
[im 129/515  soft-tissue]
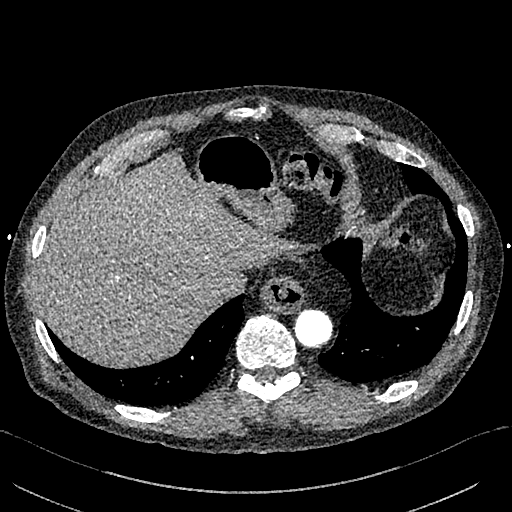
[im 129/515  bone]
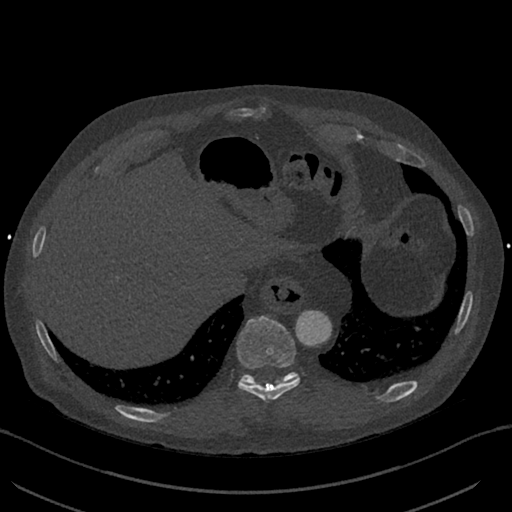
[im 258/515  soft-tissue]
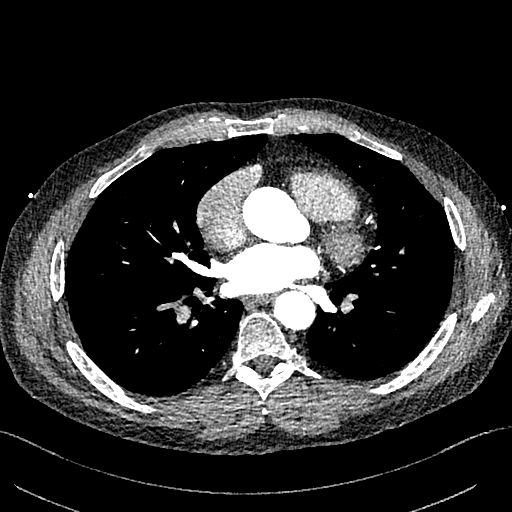
[im 386/515  soft-tissue]
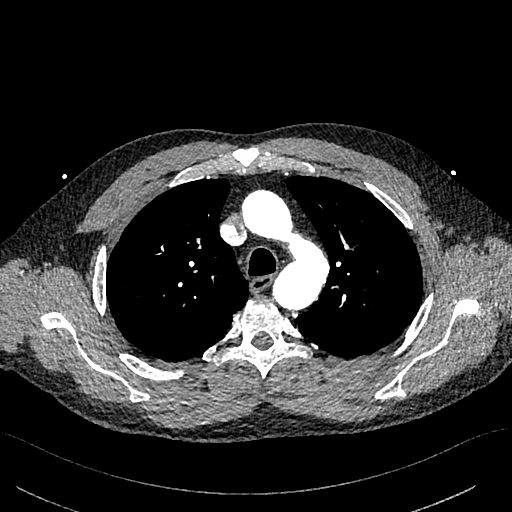

[3 of 16 positions shown; findings below may reference images not displayed]

FINDINGS: Cardiovascular: Preferential opacification of the thoracic aorta.
Similar appearing fusiform aneurysmal changes of the ascending
aorta. Scattered atherosclerotic calcifications about the aortic
arch and descending aorta. Normal heart size. No pericardial
effusion.

Sinues of Valsalva: 35 mm 34 x 34 mm ,unchanged

Sinotubular Junction: 36 mm ,unchanged

Ascending Aorta: 44 mm , previously 42 mm

Aortic Arch: 29 mm ,unchanged

Descending aorta: 30 mm at the level of the carina ,unchanged

Branch vessels: Conventional branching pattern. No significant
atherosclerotic changes.

Coronary arteries: Normal origins and courses. Moderate
atherosclerotic calcifications.

Main pulmonary artery: 25 mm ,unchanged. No evidence of central
pulmonary embolism.

Pulmonary veins: No anomalous pulmonary venous return. No evidence
of left atrial appendage thrombus.

Upper abdominal vasculature: Within normal limits.

Mediastinum/Nodes: No enlarged mediastinal, hilar, or axillary lymph
nodes. Thyroid gland, trachea, and esophagus demonstrate no
significant findings.

Lungs/Pleura: No focal consolidations. There are few unchanged
scattered less than 5 mm solid pulmonary nodules. No suspicious
pulmonary nodules. No pleural effusion or pneumothorax.

Upper Abdomen: Similar appearing small hiatal hernia. The remaining
visualized upper abdomen is within normal limits.

Musculoskeletal: No acute osseous abnormality or aggressive
appearing osseous lesion. Unchanged Schmorl's nodule in the superior
endplate of the T7 vertebral body. Spinal stimulator in place
terminating superiorly at the T8 vertebral body.

Review of the MIP images confirms the above findings.
IMPRESSION: Vascular:

1. Minimally enlarged fusiform ascending thoracic aortic aneurysm
measuring up to 4.4 cm, previously 4.2 cm. Recommend annual imaging
followup by CTA or MRA. This recommendation follows 0565
ACCF/AHA/AATS/ACR/ASA/SCA/JOSHJAX/JAE/ZEINAB/AFENYA Guidelines for the
Diagnosis and Management of Patients with Thoracic Aortic Disease.
Circulation. 0565; 121: [id number]. Aortic aneurysm NOS (RL2FK-NE9.P)
2. Coronary and aortic atherosclerosis (RL2FK-4YM.M).

Non-Vascular:

No acute intrathoracic abnormality.

## 2023-12-08 ENCOUNTER — Encounter: Payer: Self-pay | Admitting: Cardiology

## 2023-12-08 ENCOUNTER — Ambulatory Visit: Attending: Cardiology | Admitting: Cardiology

## 2023-12-08 VITALS — BP 120/68 | HR 58 | Ht 71.0 in | Wt 209.8 lb

## 2023-12-08 DIAGNOSIS — I7121 Aneurysm of the ascending aorta, without rupture: Secondary | ICD-10-CM | POA: Diagnosis not present

## 2023-12-08 DIAGNOSIS — R0609 Other forms of dyspnea: Secondary | ICD-10-CM | POA: Diagnosis not present

## 2023-12-08 DIAGNOSIS — I2510 Atherosclerotic heart disease of native coronary artery without angina pectoris: Secondary | ICD-10-CM

## 2023-12-08 DIAGNOSIS — E782 Mixed hyperlipidemia: Secondary | ICD-10-CM

## 2023-12-08 DIAGNOSIS — I10 Essential (primary) hypertension: Secondary | ICD-10-CM

## 2023-12-08 NOTE — Patient Instructions (Signed)
 Medication Instructions:   Continue all current medications.   Labwork:  none  Testing/Procedures:  Your physician has requested that you have an echocardiogram. Echocardiography is a painless test that uses sound waves to create images of your heart. It provides your doctor with information about the size and shape of your heart and how well your heart's chambers and valves are working. This procedure takes approximately one hour. There are no restrictions for this procedure. Please do NOT wear cologne, perfume, aftershave, or lotions (deodorant is allowed). Please arrive 15 minutes prior to your appointment time.  Please note: We ask at that you not bring children with you during ultrasound (echo/ vascular) testing. Due to room size and safety concerns, children are not allowed in the ultrasound rooms during exams. Our front office staff cannot provide observation of children in our lobby area while testing is being conducted. An adult accompanying a patient to their appointment will only be allowed in the ultrasound room at the discretion of the ultrasound technician under special circumstances. We apologize for any inconvenience. Office will contact with results via phone, letter or mychart.     Follow-Up:  2 months   Any Other Special Instructions Will Be Listed Below (If Applicable).   If you need a refill on your cardiac medications before your next appointment, please call your pharmacy.

## 2023-12-08 NOTE — Progress Notes (Signed)
 Clinical Summary Mr. Maiden is a 68 y.o.male seen today for follow up of the following medical problems.        1.CAD/Mild nonobstructive - Coronary angiography on 10/03/15 showed nonobstructive coronary artery disease with a 20% proximal RCA lesion, 20% proximal circumflex lesion, and 50% ostial second diagonal lesion  - shooting pains left chest. Can occur at rest or with activity. Mild intensity, feels weird difficult to describe. Generalized fatigue, somnolence. Lasts just a few seconds. Occurs 1-2 times per week - some DOE with activities walking from garage to house or walking up steps that is fairly new over the last 6 months. No recent edema -cannot run on treadmill due to chronic leg weakness, lower back pain and surgeries.     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 - 07/2018 echo LVEF 55-60% - from notes did not tolerate beta blockers due to bradycardia       3. Ascending aortic aneurysm measured at 4.1 cm by CT angiography on 02/10/2017 at [facility] - 10/2019 CTA 4.2 cm ascending aneurysm   11/2021 CTA chest 4.4 scm ascending aortic aneurysm stable - 12/2022 CTA: 4.4 cm. Stable ascending aortic aneurysm   4. Bradycardia -chronic, asymptoatmic   5. COPD - has inhaler at home, has albuterol prn.    6. Hyperlipidemia - 09/2023 TC 142 TG 70 HDL 58 LDL 70   7. HTN - he is compliant with meds    Past Medical History:  Diagnosis Date   Abnormal nuclear stress test 09/25/15   Aortic aneurysm (HCC) dx'd ~ 2013   a. 4.1 cm ascending aortic aneurysm by CT in 09/2015   Arthritis    hands (10/03/2015)   CAD (coronary artery disease)    a. cath 09/2015: 50% stenosis Ost 2nd Diag, 20% prox Cx, 20% prox RCA   Chronic radicular pain of lower back    goes into both legs (10/03/2015)   GERD (gastroesophageal reflux disease)    only when I was taking Morphine (10/03/2015)   Pneumonia 1994   Racing heart beat ~ 2013     No Known Allergies   Current  Outpatient Medications  Medication Sig Dispense Refill   aspirin  EC 81 MG tablet Take 1 tablet (81 mg total) by mouth daily. Swallow whole.     esomeprazole (NEXIUM) 40 MG capsule Take 40 mg by mouth daily. (Patient taking differently: Take 40 mg by mouth daily as needed.)     HYDROcodone -acetaminophen  (NORCO) 10-325 MG tablet Take 1 tablet by mouth every 4 (four) hours.  0   lisinopril  (ZESTRIL ) 10 MG tablet Take 10 mg by mouth daily.     polyethylene glycol powder (GLYCOLAX/MIRALAX) powder Mix 17 grams with 6-8 ounces of water or juice and drink once every morning  2   pramipexole (MIRAPEX) 0.5 MG tablet Take 0.5 mg by mouth at bedtime.     rosuvastatin  (CRESTOR ) 20 MG tablet TAKE 1 TABLET BY MOUTH EVERY DAY 90 tablet 3   pramipexole (MIRAPEX) 0.25 MG tablet Take 0.25 mg by mouth at bedtime. (Patient not taking: Reported on 12/08/2023)     No current facility-administered medications for this visit.     Past Surgical History:  Procedure Laterality Date   BACK SURGERY     CARDIAC CATHETERIZATION  10/03/2015   CARDIAC CATHETERIZATION N/A 10/03/2015   Procedure: Left Heart Cath and Coronary Angiography;  Surgeon: Peter M Swaz[place], MD;  Location: [facility] INVASIVE CV LAB;  Service: Cardiovascular;  Laterality: N/A;  CERVICAL LAMINECTOMY  1996   C5   HERNIA REPAIR     LUMBAR LAMINECTOMY  1982   PAIN PUMP IMPLANTATION  ~ 2010   Morphine   PAIN PUMP REMOVAL  ~ 2015   POSTERIOR LUMBAR FUSION  1998   L4-5, S1   ROTATOR CUFF REPAIR Left 02/2018   SHOULDER ARTHROSCOPY WITH ROTATOR CUFF REPAIR AND OPEN BICEPS TENODESIS Right 03/2015   massive biceps tear   SPINAL CORD STIMULATOR IMPLANT  2003; ~ 2015   TENS   UMBILICAL HERNIA REPAIR  ~ 1978     No Known Allergies    Family History  Problem Relation Age of Onset   Heart attack Father    Arthritis Father    Arthritis Paternal Grandmother    Healthy Son      Social History Mr. Scientist, research (medical) reports that he quit smoking about 19 years ago.  His smoking use included cigarettes. He started smoking about 54 years ago. He has a 35 pack-year smoking history. He has never used smokeless tobacco. Mr. Nabor reports that he does not currently use alcohol.   Physical Examination Today's Vitals   12/08/23 1002 12/08/23 1025  BP: 138/78 120/68  Pulse: (!) 58   SpO2: 98%   Weight: 209 lb 12.8 oz (95.2 kg)   Height: 5' 11 (1.803 m)    Body mass index is 29.26 kg/m.  Gen: resting comfortably, no acute distress HEENT: no scleral icterus, pupils equal round and reactive, no palptable cervical adenopathy,  CV: RRR, no m/rg, no jvd Resp: Clear to auscultation bilaterally GI: abdomen is soft, non-tender, non-distended, normal bowel sounds, no hepatosplenomegaly MSK: extremities are warm, no edema.  Skin: warm, no rash Neuro:  no focal deficits Psych: appropriate affect   Diagnostic Studies  07/2018 echo 1. The left ventricle has normal systolic function, with an ejection  fraction of 55-60%. The cavity size was normal. Left ventricular diastolic  Doppler parameters are consistent with impaired relaxation.   2. The right ventricle has normal systolic function. The cavity was  normal. There is no increase in right ventricular wall thickness.   3. The mitral valve is abnormal. Mild thickening of the mitral valve  leaflet. Mild calcification of the mitral valve leaflet. There is mild  mitral annular calcification present. No evidence of mitral valve  stenosis.   4. The aortic valve has an indeterminate number of cusps. Mild thickening  of the aortic valve. Mild calcification of the aortic valve. Aortic valve  regurgitation is mild to moderate by color flow Doppler. No stenosis of  the aortic valve.   5. The aortic root is normal in size and structure.   6. Pulmonary hypertension is indeterminant, inadequate TR jet.   7. The interatrial septum was not well visualized.      11/2021 CTA chest IMPRESSION: 1. Mild fusiform  enlargement of the ascending thoracic aorta, measuring 4.4 cm, unchanged from the most recent prior study. Recommend annual imaging followup by CTA or MRA. This recommendation follows 2010 ACCF/AHA/AATS/ACR/ASA/SCA/SCAI/SIR/STS/SVM Guidelines for the Diagnosis and Management of Patients with Thoracic Aortic Disease. Circulation. 2010; 121: Z733-z630. Aortic aneurysm NOS (ICD10-I71.9) 2. No acute findings within the chest. 3. Coronary artery atherosclerosis.   Assessment and Plan   1.CAD - mild nonobstructive disease by prior cath -recent atypical chest pains but also new DOE  - will start workup with echo iniitally, pending results consider coronary CTA.  - EKG today shows SR, LAFB   2. HFimpEF - mild dysfunction by  echo in 2017, follow up studies showed function had normalized - Did not tolerate beta blockers due to low HRs -with recent DOE will repeat echo     3. Ascending aortic aneurysm - mild by most recent imaging - due for repeat imaging, since possible plans for coronary CTA as well pending echo will hold on ordering at today's visit   4. HTN - bp at goal on repeat check. Continue current meds   5. Hyperlipidemia -at goal, continue current meds    F/u 6 months     Dorn PHEBE Ross, M.D.

## 2023-12-23 ENCOUNTER — Ambulatory Visit: Attending: Cardiology

## 2023-12-23 DIAGNOSIS — R0609 Other forms of dyspnea: Secondary | ICD-10-CM | POA: Diagnosis not present

## 2023-12-24 ENCOUNTER — Telehealth: Payer: Self-pay | Admitting: Nurse Practitioner

## 2023-12-24 LAB — ECHOCARDIOGRAM COMPLETE
AR max vel: 1.32 cm2
AV Area VTI: 1.37 cm2
AV Area mean vel: 1.33 cm2
AV Mean grad: 18 mmHg
AV Peak grad: 32.5 mmHg
Ao pk vel: 2.85 m/s
Area-P 1/2: 1.55 cm2
Calc EF: 65.1 %
MV VTI: 2.89 cm2
P 1/2 time: 843 ms
S' Lateral: 3.2 cm
Single Plane A2C EF: 71.4 %
Single Plane A4C EF: 57 %

## 2023-12-24 NOTE — Telephone Encounter (Signed)
 Advised patient wife Echo was done yesterday and will take time for results.  She states that the main reason she is calling because they cannot view anything in my chart, no results, no future or past appts, etc. I sent patient wife a reset password link and she did that and can still not see anything advised her I would route to our front office for further assistance.

## 2023-12-24 NOTE — Telephone Encounter (Signed)
 Pts wife requesting a callback regarding results and appts in mychart. Please advise.

## 2024-01-06 ENCOUNTER — Ambulatory Visit: Payer: Self-pay | Admitting: Cardiology

## 2024-01-08 ENCOUNTER — Other Ambulatory Visit: Payer: Self-pay | Admitting: *Deleted

## 2024-01-08 ENCOUNTER — Encounter: Payer: Self-pay | Admitting: *Deleted

## 2024-01-08 DIAGNOSIS — R079 Chest pain, unspecified: Secondary | ICD-10-CM

## 2024-01-08 DIAGNOSIS — Z01812 Encounter for preprocedural laboratory examination: Secondary | ICD-10-CM

## 2024-01-08 MED ORDER — METOPROLOL TARTRATE 100 MG PO TABS: 100.0000 mg | ORAL_TABLET | Freq: Once | ORAL | 0 refills | Status: DC

## 2024-01-19 ENCOUNTER — Telehealth (HOSPITAL_COMMUNITY): Payer: Self-pay | Admitting: *Deleted

## 2024-01-19 NOTE — Telephone Encounter (Signed)
 Reaching out to patient to offer assistance regarding upcoming cardiac imaging study; pt's wife answered phone and verbalizes understanding of appt date/time, parking situation and where to check in, pre-test NPO status, and verified current allergies; name and call back number provided for further questions should they arise  Chantal Requena RN Navigator Cardiac Imaging [facility] [phone] office [phone] cell

## 2024-01-20 ENCOUNTER — Ambulatory Visit (HOSPITAL_COMMUNITY)
Admission: RE | Admit: 2024-01-20 | Discharge: 2024-01-20 | Disposition: A | Discharge status: Home / Self Care | Source: Ambulatory Visit | Attending: Cardiology | Admitting: Cardiology

## 2024-01-20 DIAGNOSIS — I2510 Atherosclerotic heart disease of native coronary artery without angina pectoris: Secondary | ICD-10-CM | POA: Insufficient documentation

## 2024-01-20 DIAGNOSIS — I700 Atherosclerosis of aorta: Secondary | ICD-10-CM | POA: Diagnosis not present

## 2024-01-20 DIAGNOSIS — R079 Chest pain, unspecified: Secondary | ICD-10-CM | POA: Diagnosis present

## 2024-01-20 DIAGNOSIS — K449 Diaphragmatic hernia without obstruction or gangrene: Secondary | ICD-10-CM | POA: Diagnosis not present

## 2024-01-20 DIAGNOSIS — I77810 Thoracic aortic ectasia: Secondary | ICD-10-CM | POA: Insufficient documentation

## 2024-01-20 MED ORDER — NITROGLYCERIN 0.4 MG SL SUBL
0.8000 mg | SUBLINGUAL_TABLET | Freq: Once | SUBLINGUAL | Status: AC
  Administered 2024-01-20: 0.8 mg via SUBLINGUAL

## 2024-01-20 MED ORDER — IOHEXOL 350 MG/ML SOLN
100.0000 mL | Freq: Once | INTRAVENOUS | Status: AC | PRN
Start: 2024-01-20 — End: 2024-01-20
  Administered 2024-01-20: 100 mL via INTRAVENOUS

## 2024-02-05 ENCOUNTER — Ambulatory Visit: Payer: Self-pay | Admitting: Cardiology

## 2024-02-09 ENCOUNTER — Ambulatory Visit: Admitting: Cardiology

## 2024-02-09 ENCOUNTER — Ambulatory Visit: Attending: Nurse Practitioner | Admitting: Nurse Practitioner

## 2024-02-09 ENCOUNTER — Encounter: Payer: Self-pay | Admitting: Nurse Practitioner

## 2024-02-09 VITALS — BP 114/70 | HR 66 | Ht 71.0 in | Wt 213.0 lb

## 2024-02-09 DIAGNOSIS — I2510 Atherosclerotic heart disease of native coronary artery without angina pectoris: Secondary | ICD-10-CM | POA: Diagnosis not present

## 2024-02-09 DIAGNOSIS — I5020 Unspecified systolic (congestive) heart failure: Secondary | ICD-10-CM | POA: Diagnosis not present

## 2024-02-09 DIAGNOSIS — E785 Hyperlipidemia, unspecified: Secondary | ICD-10-CM

## 2024-02-09 DIAGNOSIS — R0789 Other chest pain: Secondary | ICD-10-CM

## 2024-02-09 DIAGNOSIS — I7121 Aneurysm of the ascending aorta, without rupture: Secondary | ICD-10-CM

## 2024-02-09 DIAGNOSIS — I10 Essential (primary) hypertension: Secondary | ICD-10-CM

## 2024-02-09 NOTE — Patient Instructions (Signed)
 Medication Instructions:   Take Nexium daily  All other medications stay the same   Labwork: None today  Testing/Procedures: None today  Follow-Up: 6 months with E.Peck,NP  Any Other Special Instructions Will Be Listed Below (If Applicable).  If you need a refill on your cardiac medications before your next appointment, please call your pharmacy.

## 2024-02-09 NOTE — Progress Notes (Unsigned)
 Cardiology Office Note   Date:  02/09/2024 ID:  Marinell BIRCH Mandell Jr., DOB [birth year], MRN [id number] PCP: Maree Isles, MD  [facility] Providers Cardiologist:  Alvan Carrier, MD     History of Present Illness  Antolin Schloesser Mickey. is a 68 y.o. male with a PMH of CAD, HFimpEF, Ascending aortic aneurysm, HLD, HTN, bradycardia, and COPD, who presents today for 2 month follow-up.   Last seen by Dr. Alvan on 12/08/2023.  At that time, patient noted some recent atypical chest pains but new DOE. Echo was arranged and showed normal LVEF, mild aortic valve regurgitation and stenosis.  Coronary CTA was arranged for further evaluation of his symptoms and revealed minimal to mild mixed nonobstructive CAD with also aortic atherosclerosis noted, recommended for aggressive risk factor modification and showed small to moderate hiatal hernia with known dilated ascending aorta at 4.3 cm.  Today he is here for follow-up.  He states he is still having sharp chest pains and wonders if this is related to GERD.  Says he noticed this happens after eating spaghetti sauce and developed sharp chest pains.  Tells me he is only taking Nexium as needed.  Feels low on energy, started taking B12 injections recently. Denies any shortness of breath, palpitations, syncope, presyncope, dizziness, orthopnea, PND, swelling or significant weight changes, acute bleeding, or claudication.   ROS: Negative.  See HPI. 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  Studies Reviewed  EKG: EKG is not ordered today.  Coronary CTA 10/25: IMPRESSION: 1. Minimal to mild mixed non-obstructive CAD, CADRADS = 2.   2. Normal coronary origin with right dominance.   3. Coronary artery calcium  score is 1295, which places the patient in the 90th percentile for age/race and sex-matched controls (MESA).   4. Aortic valve calcium  score of 1695 (moderate), correlation with degree of stenosis on echo recommended.   5. Aortic atherosclerosis.   6.  Aggressive risk factor modification is recommended.  IMPRESSION: 1. Small to moderate hiatal hernia. 2. Dilated ascending aorta at 4.3 cm. Recommend annual imaging followup by CTA or MRA. This recommendation follows 2010 ACCF/AHA/AATS/ACR/ASA/SCA/SCAI/SIR/STS/SVM Guidelines for the Diagnosis and Management of Patients with Thoracic Aortic Disease. Circulation. 2010; 121: Z733-z630. Aortic aneurysm NOS (ICD10-I71.9) 3.  Aortic Atherosclerosis (ICD10-I70.0).  Echo 11/2023:  1. Left ventricular ejection fraction, by estimation, is 50 to 55%. The  left ventricle has low normal function. The left ventricle has no regional  wall motion abnormalities. There is mild left ventricular hypertrophy.  Left ventricular diastolic  parameters are consistent with Grade I diastolic dysfunction (impaired  relaxation). The average left ventricular global longitudinal strain is  -19.9 %. The global longitudinal strain is normal.   2. Right ventricular systolic function is normal. The right ventricular  size is normal.   3. The mitral valve is normal in structure. Mild mitral valve  regurgitation. No evidence of mitral stenosis.   4. The aortic valve has an indeterminant number of cusps. There is  moderate thickening of the aortic valve. Aortic valve regurgitation is  mild. Mild to moderate aortic valve stenosis. Aortic regurgitation PHT  measures 843 msec. Aortic valve area, by VTI   measures 1.37 cm. Aortic valve mean gradient measures 18.0 mmHg. Aortic  valve Vmax measures 2.85 m/s.   5. Aortic dilatation noted. There is borderline dilatation of the aortic  root, measuring 39 mm. There is severe dilatation of the ascending aorta,  measuring 46 mm.   Comparison(s): A prior study was performed on 08/19/2018. EF  55-60%. Mild  to moderate aortic regurgitation. No aortic valve stenosis. Aortic root 39  mm.  Left heart cath 09/2015: Prox RCA lesion, 20% stenosed. Prox Cx lesion, 20% stenosed. Ost 2nd  Diag lesion, 50% stenosed. The left ventricular systolic function is normal.   1. Nonobstructive CAD 2. Normal LV function 3. Enlarged thoracic aorta c/w aneurysm.   Plan: check Echo. Obtain CT angiogram of the aorta.   Physical Exam VS:  BP 114/70 (BP Location: Left Arm, Cuff Size: Large)   Pulse 66   Ht 5' 11 (1.803 m)   Wt 213 lb (96.6 kg)   SpO2 97%   BMI 29.71 kg/m        Wt Readings from Last 3 Encounters:  02/09/24 213 lb (96.6 kg)  12/08/23 209 lb 12.8 oz (95.2 kg)  10/15/22 220 lb 9.6 oz (100.1 kg)    GEN: Well nourished, well developed in no acute distress NECK: No JVD; No carotid bruits CARDIAC: S1/S2, RRR, no murmurs, rubs, gallops RESPIRATORY:  Clear to auscultation without rales, wheezing or rhonchi  ABDOMEN: Soft, non-tender, non-distended EXTREMITIES:  No edema; No deformity   ASSESSMENT AND PLAN  Atypical chest pain, CAD  Atypical etiology and points to diagnosis of GERD.  His recent coronary CTA revealed mild/minimal nonobstructive CAD.  Small to moderate hiatal hernia also noted.  No indication for further workup at this time.  I recommend that he return to take Nexium 40 mg daily instead of as needed to help his symptoms.  Recommend to follow-up with PCP.  Continue aspirin , lisinopril , and rosuvastatin . Heart healthy diet and regular cardiovascular exercise encouraged. Care and ED precautions discussed.   HFimpEF Stage C, NYHA class I symptoms.  EF 50 to 55%. Euvolemic and well compensated on exam. Low sodium diet, fluid restriction <2L, and daily weights encouraged. Educated to contact our office for weight gain of 2 lbs overnight or 5 lbs in one week.  No medication changes at this time.  Hypertension Blood pressure is stable. Discussed to monitor BP at home at least 2 hours after medications and sitting for 5-10 minutes.  Continue lisinopril . Heart healthy diet and regular cardiovascular exercise encouraged.   Hyperlipidemia LDL 70 from earlier this  year. Continue rosuvastatin . Heart healthy diet and regular cardiovascular exercise encouraged.  Not addressed, consider adding Zetia to medication regimen at next office visit.  Ascending aortic aneurysm Dilated ascending aorta at 4.3 cm noted on recent coronary CT.  Recommended to follow-up annually.  Plan to readdress this in October 2026.   Dispo: Care and ED precautions discussed. Follow-up with MD/APP in 6 months or sooner any changes.  Signed, Almarie Crate, NP

## 2024-02-11 ENCOUNTER — Encounter: Payer: Self-pay | Admitting: *Deleted

## 2024-05-01 ENCOUNTER — Other Ambulatory Visit: Payer: Self-pay | Admitting: Cardiology

## 2024-05-04 NOTE — Telephone Encounter (Signed)
 Pt had a lipid panel done on 10/23/2023. Medication sent to pt's pharmacy as requested.

## 2024-05-12 ENCOUNTER — Ambulatory Visit: Admitting: Nurse Practitioner
